# Patient Record
Sex: Female | Born: 1960 | Race: Black or African American | Hispanic: No | Smoking: Current every day smoker
Health system: Southern US, Community
[De-identification: ages and names within clinical notes are randomized; demographics above are authoritative.]

## PROBLEM LIST (undated history)

## (undated) DIAGNOSIS — M549 Dorsalgia, unspecified: Secondary | ICD-10-CM

## (undated) DIAGNOSIS — M1990 Unspecified osteoarthritis, unspecified site: Secondary | ICD-10-CM

## (undated) DIAGNOSIS — E0590 Thyrotoxicosis, unspecified without thyrotoxic crisis or storm: Secondary | ICD-10-CM

## (undated) DIAGNOSIS — G8929 Other chronic pain: Secondary | ICD-10-CM

## (undated) DIAGNOSIS — R7303 Prediabetes: Secondary | ICD-10-CM

## (undated) DIAGNOSIS — I10 Essential (primary) hypertension: Secondary | ICD-10-CM

## (undated) HISTORY — PX: HERNIA REPAIR: SHX51

## (undated) HISTORY — PX: NECK SURGERY: SHX720

## (undated) HISTORY — PX: THYROID SURGERY: SHX805

## (undated) HISTORY — PX: KNEE SURGERY: SHX244

## (undated) HISTORY — PX: ABDOMINAL HYSTERECTOMY: SHX81

---

## 1990-08-28 HISTORY — PX: OVARY SURGERY: SHX727

## 2000-08-28 HISTORY — PX: HAMMER TOE SURGERY: SHX385

## 2004-03-22 ENCOUNTER — Emergency Department (HOSPITAL_COMMUNITY): Admission: EM | Admit: 2004-03-22 | Discharge: 2004-03-22 | Payer: Self-pay | Admitting: Emergency Medicine

## 2005-08-01 ENCOUNTER — Emergency Department (HOSPITAL_COMMUNITY): Admission: EM | Admit: 2005-08-01 | Discharge: 2005-08-01 | Payer: Self-pay | Admitting: Emergency Medicine

## 2007-05-28 ENCOUNTER — Ambulatory Visit (HOSPITAL_COMMUNITY): Admission: RE | Admit: 2007-05-28 | Discharge: 2007-05-28 | Payer: Self-pay | Admitting: Family Medicine

## 2007-07-21 ENCOUNTER — Emergency Department (HOSPITAL_COMMUNITY): Admission: EM | Admit: 2007-07-21 | Discharge: 2007-07-21 | Payer: Self-pay | Admitting: Emergency Medicine

## 2007-07-29 ENCOUNTER — Encounter: Admission: RE | Admit: 2007-07-29 | Discharge: 2007-08-28 | Payer: Self-pay | Admitting: Neurological Surgery

## 2007-08-29 ENCOUNTER — Encounter: Admission: RE | Admit: 2007-08-29 | Discharge: 2007-09-12 | Payer: Self-pay | Admitting: Neurological Surgery

## 2007-08-29 HISTORY — PX: BACK SURGERY: SHX140

## 2008-01-26 ENCOUNTER — Encounter: Admission: RE | Admit: 2008-01-26 | Discharge: 2008-01-26 | Payer: Self-pay | Admitting: Neurological Surgery

## 2008-02-04 ENCOUNTER — Ambulatory Visit (HOSPITAL_COMMUNITY): Admission: RE | Admit: 2008-02-04 | Discharge: 2008-02-05 | Payer: Self-pay | Admitting: Neurological Surgery

## 2008-03-03 ENCOUNTER — Encounter: Admission: RE | Admit: 2008-03-03 | Discharge: 2008-03-03 | Payer: Self-pay | Admitting: Neurological Surgery

## 2008-03-20 ENCOUNTER — Ambulatory Visit: Admission: RE | Admit: 2008-03-20 | Discharge: 2008-03-20 | Payer: Self-pay | Admitting: Neurological Surgery

## 2008-04-02 ENCOUNTER — Inpatient Hospital Stay (HOSPITAL_COMMUNITY): Admission: RE | Admit: 2008-04-02 | Discharge: 2008-04-06 | Payer: Self-pay | Admitting: Neurological Surgery

## 2008-05-05 ENCOUNTER — Encounter: Admission: RE | Admit: 2008-05-05 | Discharge: 2008-05-05 | Payer: Self-pay | Admitting: Neurological Surgery

## 2008-06-02 ENCOUNTER — Encounter: Admission: RE | Admit: 2008-06-02 | Discharge: 2008-06-02 | Payer: Self-pay | Admitting: Neurological Surgery

## 2008-06-25 ENCOUNTER — Ambulatory Visit (HOSPITAL_COMMUNITY): Admission: RE | Admit: 2008-06-25 | Discharge: 2008-06-25 | Payer: Self-pay | Admitting: Neurological Surgery

## 2008-08-18 ENCOUNTER — Encounter: Admission: RE | Admit: 2008-08-18 | Discharge: 2008-08-18 | Payer: Self-pay | Admitting: Neurological Surgery

## 2008-11-16 ENCOUNTER — Encounter: Admission: RE | Admit: 2008-11-16 | Discharge: 2008-11-16 | Payer: Self-pay | Admitting: Neurological Surgery

## 2009-02-15 ENCOUNTER — Encounter: Admission: RE | Admit: 2009-02-15 | Discharge: 2009-02-15 | Payer: Self-pay | Admitting: Neurological Surgery

## 2009-05-17 ENCOUNTER — Encounter: Admission: RE | Admit: 2009-05-17 | Discharge: 2009-05-17 | Payer: Self-pay | Admitting: Neurological Surgery

## 2009-07-06 ENCOUNTER — Encounter: Admission: RE | Admit: 2009-07-06 | Discharge: 2009-07-06 | Payer: Self-pay | Admitting: Neurological Surgery

## 2009-08-18 ENCOUNTER — Ambulatory Visit (HOSPITAL_COMMUNITY): Admission: RE | Admit: 2009-08-18 | Discharge: 2009-08-18 | Payer: Self-pay | Admitting: Anesthesiology

## 2009-08-28 HISTORY — PX: NECK SURGERY: SHX720

## 2009-09-08 ENCOUNTER — Ambulatory Visit (HOSPITAL_COMMUNITY): Admission: RE | Admit: 2009-09-08 | Discharge: 2009-09-08 | Payer: Self-pay | Admitting: Neurological Surgery

## 2009-09-15 ENCOUNTER — Encounter: Admission: RE | Admit: 2009-09-15 | Discharge: 2009-09-15 | Payer: Self-pay | Admitting: Neurosurgery

## 2009-09-23 ENCOUNTER — Emergency Department (HOSPITAL_COMMUNITY): Admission: EM | Admit: 2009-09-23 | Discharge: 2009-09-23 | Payer: Self-pay | Admitting: Emergency Medicine

## 2009-10-03 ENCOUNTER — Emergency Department (HOSPITAL_COMMUNITY): Admission: EM | Admit: 2009-10-03 | Discharge: 2009-10-03 | Payer: Self-pay | Admitting: Emergency Medicine

## 2009-10-19 ENCOUNTER — Encounter: Admission: RE | Admit: 2009-10-19 | Discharge: 2009-10-19 | Payer: Self-pay | Admitting: Neurological Surgery

## 2009-11-18 ENCOUNTER — Inpatient Hospital Stay (HOSPITAL_COMMUNITY): Admission: RE | Admit: 2009-11-18 | Discharge: 2009-11-19 | Payer: Self-pay | Admitting: Neurological Surgery

## 2009-12-01 ENCOUNTER — Ambulatory Visit (HOSPITAL_COMMUNITY): Admission: RE | Admit: 2009-12-01 | Discharge: 2009-12-01 | Payer: Self-pay | Admitting: Obstetrics & Gynecology

## 2009-12-01 ENCOUNTER — Ambulatory Visit: Payer: Self-pay | Admitting: Obstetrics and Gynecology

## 2009-12-09 ENCOUNTER — Encounter: Admission: RE | Admit: 2009-12-09 | Discharge: 2009-12-09 | Payer: Self-pay | Admitting: Obstetrics & Gynecology

## 2009-12-21 ENCOUNTER — Encounter: Admission: RE | Admit: 2009-12-21 | Discharge: 2009-12-21 | Payer: Self-pay | Admitting: Neurological Surgery

## 2010-03-31 IMAGING — CR DG CERVICAL SPINE 1V
1 series · 1 of 1 positions shown · non-contrast
Comparison: Intraoperative C-arm spot films of 02/04/2008

CLINICAL DATA: Anterior cervical spine fusion, having neck pain

CERVICAL SPINE - 1 VIEW

[view not recorded]
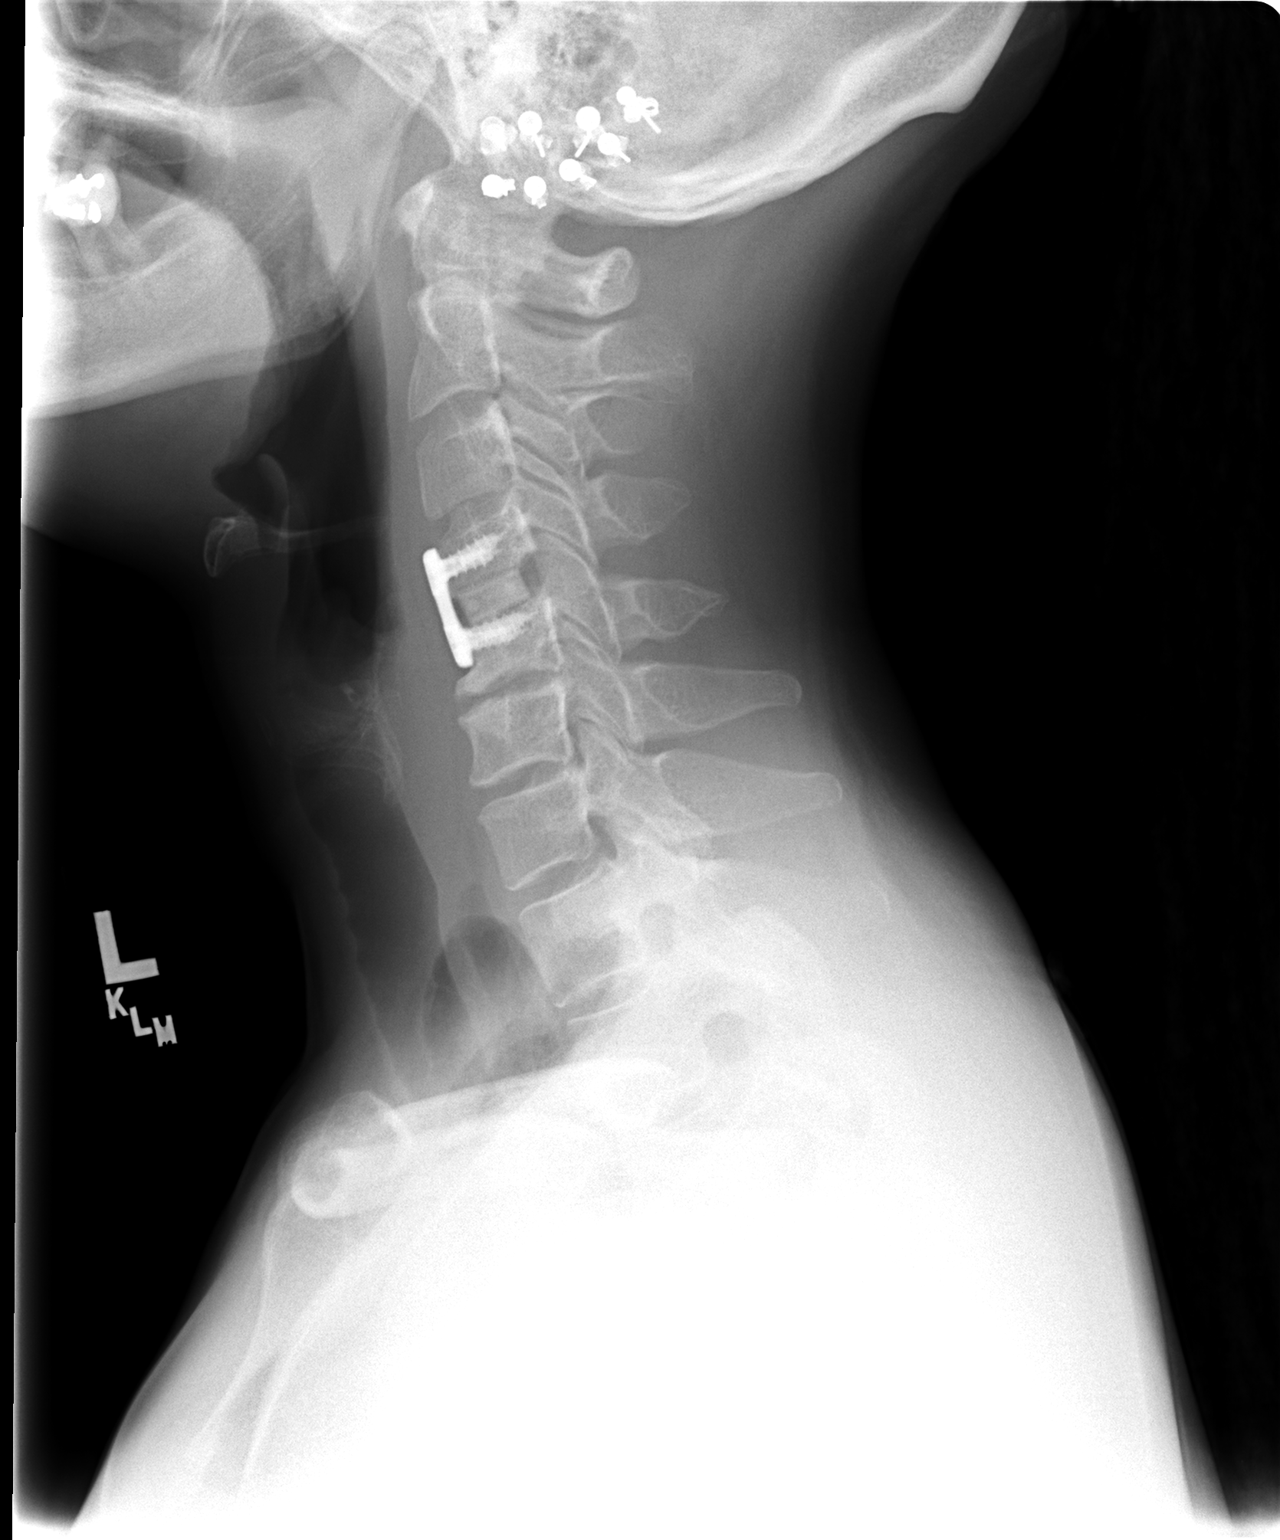

[1 of 1 positions shown; findings below may reference images not displayed]

FINDINGS: The cervical vertebral remain straightened in alignment.
Anterior fusion at C4-5 appears stable.  Interbody fusion plug is
unchanged in position as is the anterior metallic fixation plate.
No prevertebral soft tissue swelling is seen.
IMPRESSION: Stable appearance of anterior fusion at C4-5 with straightened
alignment.

## 2010-04-11 ENCOUNTER — Encounter: Admission: RE | Admit: 2010-04-11 | Discharge: 2010-04-11 | Payer: Self-pay | Admitting: Neurological Surgery

## 2010-09-18 ENCOUNTER — Encounter: Payer: Self-pay | Admitting: *Deleted

## 2010-09-19 ENCOUNTER — Encounter: Payer: Self-pay | Admitting: Family Medicine

## 2010-10-31 ENCOUNTER — Other Ambulatory Visit: Payer: Self-pay | Admitting: Neurological Surgery

## 2010-10-31 DIAGNOSIS — M5412 Radiculopathy, cervical region: Secondary | ICD-10-CM

## 2010-10-31 DIAGNOSIS — M549 Dorsalgia, unspecified: Secondary | ICD-10-CM

## 2010-10-31 DIAGNOSIS — M5020 Other cervical disc displacement, unspecified cervical region: Secondary | ICD-10-CM

## 2010-11-04 ENCOUNTER — Other Ambulatory Visit: Payer: Self-pay | Admitting: Neurological Surgery

## 2010-11-04 ENCOUNTER — Ambulatory Visit
Admission: RE | Admit: 2010-11-04 | Discharge: 2010-11-04 | Disposition: A | Discharge status: Home / Self Care | Payer: Medicaid Other | Source: Ambulatory Visit | Attending: Neurological Surgery | Admitting: Neurological Surgery

## 2010-11-04 DIAGNOSIS — M549 Dorsalgia, unspecified: Secondary | ICD-10-CM

## 2010-11-04 DIAGNOSIS — M5020 Other cervical disc displacement, unspecified cervical region: Secondary | ICD-10-CM

## 2010-11-04 DIAGNOSIS — M5410 Radiculopathy, site unspecified: Secondary | ICD-10-CM

## 2010-11-04 DIAGNOSIS — M5412 Radiculopathy, cervical region: Secondary | ICD-10-CM

## 2010-11-20 LAB — CBC
HCT: 38.7 % (ref 36.0–46.0)
Hemoglobin: 13.7 g/dL (ref 12.0–15.0)
MCV: 101.3 fL — ABNORMAL HIGH (ref 78.0–100.0)
WBC: 6.2 10*3/uL (ref 4.0–10.5)

## 2010-11-20 LAB — SURGICAL PCR SCREEN
MRSA, PCR: NEGATIVE
Staphylococcus aureus: NEGATIVE

## 2010-11-20 LAB — DIFFERENTIAL
Eosinophils Absolute: 0 10*3/uL (ref 0.0–0.7)
Eosinophils Relative: 0 % (ref 0–5)
Lymphocytes Relative: 31 % (ref 12–46)
Lymphs Abs: 1.9 10*3/uL (ref 0.7–4.0)
Monocytes Absolute: 0.2 10*3/uL (ref 0.1–1.0)

## 2010-11-20 LAB — APTT: aPTT: 27 seconds (ref 24–37)

## 2010-11-20 LAB — BASIC METABOLIC PANEL
BUN: 17 mg/dL (ref 6–23)
Chloride: 104 mEq/L (ref 96–112)
GFR calc non Af Amer: >60 mL/min (ref >60)
Glucose, Bld: 92 mg/dL (ref 70–99)
Potassium: 4.3 mEq/L (ref 3.5–5.1)
Sodium: 137 mEq/L (ref 135–145)

## 2011-01-10 NOTE — Discharge Summary (Signed)
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   PROCEDURE:  Posterior lumbar interbody fusion, L4-L5.   HISTORY OF PRESENT ILLNESS:  Ms. Katherine Russell is a 50 year old female who  had a long history of back pain with leg pain, left greater than right.  She had MRIs done about a year apart, which showed significant change at  L4-L5 with a larger disk herniation and significant facet arthrosis and  evidence of some segmental instability.  I recommended a posterior  lumbar interbody fusion at L4-L5.  She understood the risks, benefits,  and expected outcome, and wished to proceed.   HOSPITAL COURSE:  The patient was admitted on April 02, 2008, and taken  to the operating room where she underwent a posterior lumbar interbody  fusion at L4-L5.  The patient tolerated the procedure well, was taken to  the recovery room, and then to the floor in stable condition.  For  details of the operative procedure, please see the dictated operative  note.  The patient's hospital course routine.  There were no  complications.  Her incision remained clean, dry, and intact.  She  remained on a Dilaudid PCA for a couple of days for pain control.  This  was then discontinued.  She was able to walk up and down the halls  without difficulty.  She had a nonfocal neurologic exam.  She was  tolerating regular diet.  She was discharged home in stable condition on  April 06, 2008, with plans to follow up with me in 1 week.   FINAL DIAGNOSIS:  Posterior lumbar interbody fusion,  L4-L5.      Tia Alert, MD  Electronically Signed     DSJ/MEDQ  D:  04/06/2008  T:  04/06/2008  Job:  [id number]

## 2011-01-10 NOTE — Op Note (Signed)
NAMEDAILEY, ALBERSON              ACCOUNT NO.:  [id number]   MEDICAL RECORD NO.:  [id number]          PATIENT TYPE:  INP   LOCATION:  3011                         FACILITY:  [facility]   PHYSICIAN:  Tia Alert, MD     DATE OF BIRTH:  1960-10-09   DATE OF PROCEDURE:  04/02/2008  DATE OF DISCHARGE:                               OPERATIVE REPORT   PREOPERATIVE DIAGNOSES:  Lumbar spondylosis with lumbar disk herniation  L4-5 with degenerative disk disease and diskogenic back pain with leg  pain.   POSTOPERATIVE DIAGNOSES:  Lumbar spondylosis with lumbar disk herniation  L4-5 with degenerative disk disease and diskogenic back pain with leg  pain.   PROCEDURE:  1. Decompressive lumbar laminectomy, hemi facetectomy, and      foraminotomy for decompression at L4 and L5 nerve roots requiring      more work than is typically required a simple posterior lumbar      interbody fusion procedure.  2. Posterior lumbar interbody fusion, L4-5, utilizing a 12 x 22 mm      Tangent interbody bone wedge and 12 x 22 mm PEEK interbody cage      packed with local autograft and Actifuse putty.  3. Intertransverse arthrodesis, L4-5, utilizing locally harvested      morselized autologous bone graft and Actifuse putty.  4. Nonsegmental fixation L4-5 utilizing the Xia pedicle screw system.   SURGEON:  Tia Alert, MD   ASSISTANT:  Donalee Citrin, MD   ANESTHESIA:  General endotracheal.   COMPLICATIONS:  None apparent.   INDICATIONS FOR PROCEDURE:  Ms. Leitha Schuller is a 50 year old female with a  long history of progressively worsening diskogenic back pain.  She had  MRIs which showed worsening disk herniation and spondylosis with facet  arthrosis at L4-5.  I recommended a posterior lumbar interbody fusion at  L4-5 to address what I felt was likely some segmental instability and  facet arthrosis and degenerative disk disease and some canal stenosis  with worsening disk herniation.  She understood the risks,  benefits, and  expected outcome and wished to proceed.   DESCRIPTION OF PROCEDURE:  The patient was taken to the operating room  and after induction of adequate generalized endotracheal anesthesia, she  was rolled into the prone position on chest rolls and all pressure  points were padded.  Her lumbar region was prepped with DuraPrep and  then draped in usual sterile fashion.  Local anesthesia 10 mL was  injected, and a dorsal midline incision was made and carried down to the  lumbosacral fascia.  The fascia was opened and the paraspinous  musculature was taken down in a subperiosteal fashion to expose L4-5.  Intraoperative fluoroscopy confirmed my level and then I took the  dissection out over the facets to expose the transverse processes of L4  and L5.  I used the Leksell rongeur and Kerrison punches to perform a  complete laminectomy and hemi facetectomy at L4-5 to decompress the L4  and L5 nerve roots.  The underlying yellow ligament was removed in a  piecemeal fashion.  The L4 and L5 nerve  roots were followed distally  into their foramina marching along the pedicle wall.  Once the  decompression was complete, we incised the disk space bilaterally and  performed initial diskectomies with pituitary rongeurs and curved  curettes.  There was significant midline disk herniation.  The disk was  very degenerated and disrupted.  We distracted the space to 12 mm and  then used the 12 mm rotating cutter and cutting chisel bilaterally to  prepare the endplates for arthrodesis.  The midline was scraped with an  Epstein curette.  Once a near total diskectomy was performed and the  endplates were prepared, we used a 22-mm PEEK interbody cage packed with  local autograft and Actifuse and tapped this into position on the  patient's left side.  We used a Tangent bone wedge on the patient's  right side and the midline was packed local autograft and Actifuse.  We  then localized the pedicle screw  entry zones utilizing surface landmarks  and lateral fluoroscopy.  We probed each pedicle with a pedicle probe,  tapped each pedicle with a 5.5 tap, and then placed 65 x 45 mm pedicle  screws into the pedicles of L4 and L5 bilaterally.  The pedicles were  probed with a ball probe prior to placement of the screws to assure no  break in the cortex.  We then placed lordotic rods into the multiaxial  screw heads and locked these in position with locking caps and anti-  torque device.  The transverse processes of L4 and L5 were decorticated  and local autograft and Actifuse were placed __________ to  perform  intertransverse arthrodesis L4-5.  We then irrigated with saline  solution containing bacitracin, dried all bleeding points, lined the  dura with Gelfoam, AP and lateral fluoroscopy to check our final  construct, and then closed the muscle and fascia with 0 Vicryl, closed  the subcutaneous and subcuticular tissue with 2-0 and 3-0 Vicryl, and  closed the skin with Benzoin and Steri-Strips.  The drapes were removed.  A sterile dressing was applied.  The patient was awakened from general  anesthesia and transferred to recovery room in stable condition.  At the  end of the procedure, all sponge, needle, and instruments counts were  correct.      Tia Alert, MD  Electronically Signed     DSJ/MEDQ  D:  04/02/2008  T:  04/03/2008  Job:  [id number]

## 2011-01-10 NOTE — Op Note (Signed)
[name], SPAID NO.:  [id number]   MEDICAL RECORD NO.:  [id number]          PATIENT TYPE:  OIB   LOCATION:  3524                         FACILITY:  [facility]   PHYSICIAN:  Tia Alert, MD     DATE OF BIRTH:  31-Mar-1961   DATE OF PROCEDURE:  02/04/2008  DATE OF DISCHARGE:                               OPERATIVE REPORT   PREOPERATIVE DIAGNOSIS:  Cervical disk herniation at C4-5 on the left  with left C5 radiculopathy.   POSTOPERATIVE DIAGNOSIS:  Cervical disk herniation at C4-5 on the left  with left C5 radiculopathy.   PROCEDURES:  1. Decompressive anterior cervical diskectomy at C4-5 for nerve root      decompression  2. Anterior cervical arthrodesis at C4-5 utilizing a 6-mm      corticocancellous allograft.  3. Anterior cervical plating at C4-5 utilizing the Stryker reflex      hybrid plate.   SURGEON:  Tia Alert, MD   ASSISTANT:  Reinaldo Meeker, MD   ANESTHESIA:  General endotracheal.   COMPLICATIONS:  None apparent.   INDICATIONS FOR PROCEDURE:  Ms. Katherine Russell is a 50 year old female who was  referred with severe left arm pain that is seemed to follow on C5  distribution.  She had an MRI, which showed significant spondylosis of  the cervical spine with reversed cervical lordosis with degenerative  disease at C5-6 and C6-7, but a large soft disk herniation at C4-5 on  the left.  She had severe pain.  I recommended a one-level anterior  cervical diskectomy at C4-5.  I did not think C5-6 and C6-7 necessarily  had to be done at this time.  I felt that this would address her pain  syndrome.  She understood the risks, benefits, expected outcome, and  wished to proceed.   DESCRIPTION OF PROCEDURE:  The patient was taken to the operating room.  After induction of adequate generalized endotracheal anesthesia, she was  placed in supine position on the operating room table.  Her right  anterior cervical region was prepped with DuraPrep and draped  in usual  sterile fashion.  A 5 mL of local anesthesia was injected and transverse  incision was made to the right of midline and carried down to the  platysma which was elevated, opened, and undermined with Metzenbaum  scissors.  I then dissected a plane medial to the sternocleidomastoid  muscle and internal carotid artery and lateral to the trachea and  esophagus to expose C4-5.  Intraoperative fluoroscopy confirmed my level  and then I took down the longus colli muscles and placed the Shadow-Line  retractors under this to expose C4-5.  The annulus was incised and  initial diskectomy was done with pituitary rongeurs and curved curettes.  I then used the high-speed drill to drill the endplates.  We drilled to  a height of 6 mm, it was a very collapsed disk space, it was very  spondylotic.  We drilled down to the level of the post longitudinal  ligament.  The operating microscope was brought to the field.  The  posterior longitudinal ligament was  opened with a black nerve hook and  then removed in circumferential fashion.  While undercutting the bodies  of C3 and C4, there was a large disk fragments removed from the foramen  at C4-5 on the left side.  We marched along until the pedicles palpable  and marched along the pedicles to identify the C5 nerve root and  followed it out into the foramen until we were past the pedicle level.  I then palpated with a nerve hook in a circumferential fashion.  Bilateral foraminotomies had been performed.  The undersurfaces of the  C4-C5 vertebral bodies had been undercut.  The dura was full all the way  across and looked well decompressed.  The C5 nerve root was well  decompressed.  The nerve root passed easily without felt no more free  fragments.  I then irrigated with saline solution, dried the surgical  bed with Gelfoam, measured the interspace to be 6 mm, and then used a 6-  mm corticocancellous allograft and tapped this into position at C4-5 and   a lordotic graft was used.  We then used a Stryker reflex hybrid plate  and placed two 12 mm variable angle screws in the bodies of C4-C5 and  checked this under fluoroscopy.  We noticed that we could easily put 14  mm screws and then used two 14 mm screws at C4 and C5 and these locked  in the plate by locking mechanism within the plate.  We then irrigated  with saline solution containing bacitracin, dried all bleeding points  and once meticulous hemostasis was achieved, closed the platysma with 3-  0 Vicryl and closed the subcuticular tissue with 3-0 Vicryl and closed  the skin with benzoin and Steri-strips.  The drapes were removed and the  sterile dressings was applied.  The patient was awakened from the  general anesthesia and transferred to the recovery room in stable  condition.  At the end of the procedure, all sponge,  needle, and  instrument counts were correct.      Tia Alert, MD  Electronically Signed     DSJ/MEDQ  D:  02/04/2008  T:  02/05/2008  Job:  [id number]

## 2011-03-15 IMAGING — CR DG LUMBAR SPINE COMPLETE 4+V
5 series · 5 of 5 positions shown · non-contrast
Comparison: 11/16/2008

CLINICAL DATA: Follow up lumbar fusion.  Low back pain.

LUMBAR SPINE - COMPLETE 4+ VIEW

[view not recorded (1 of 5)]
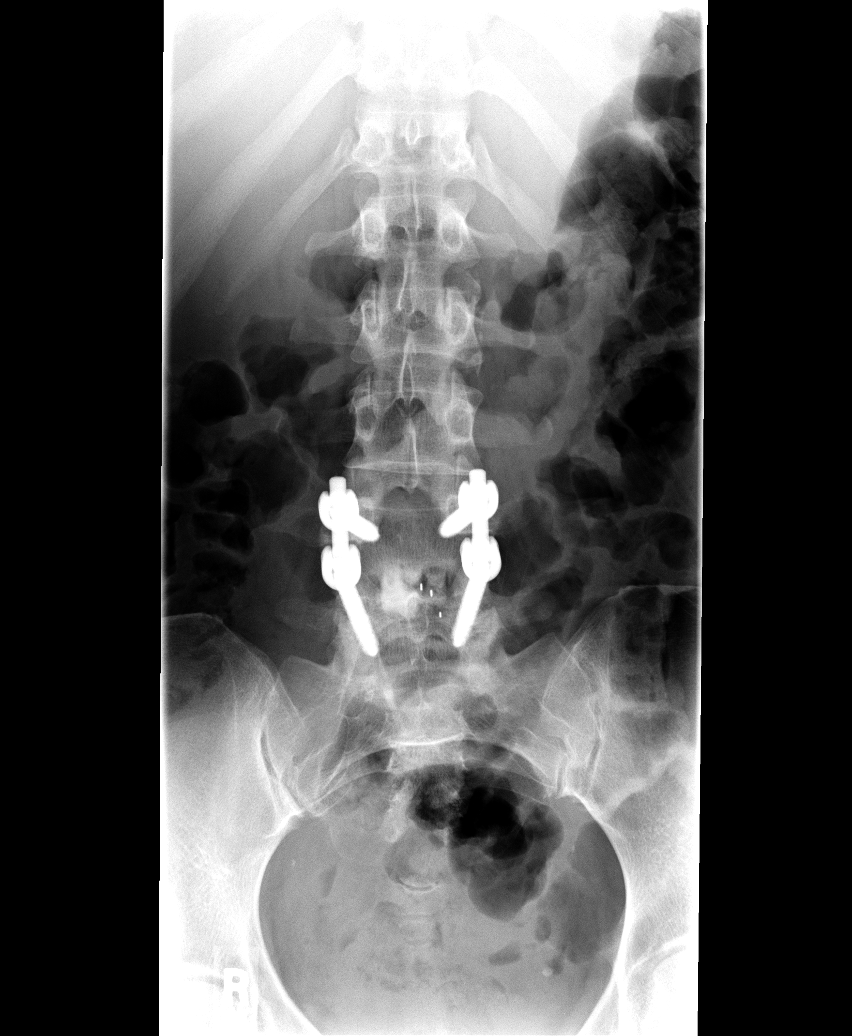

[view not recorded (2 of 5)]
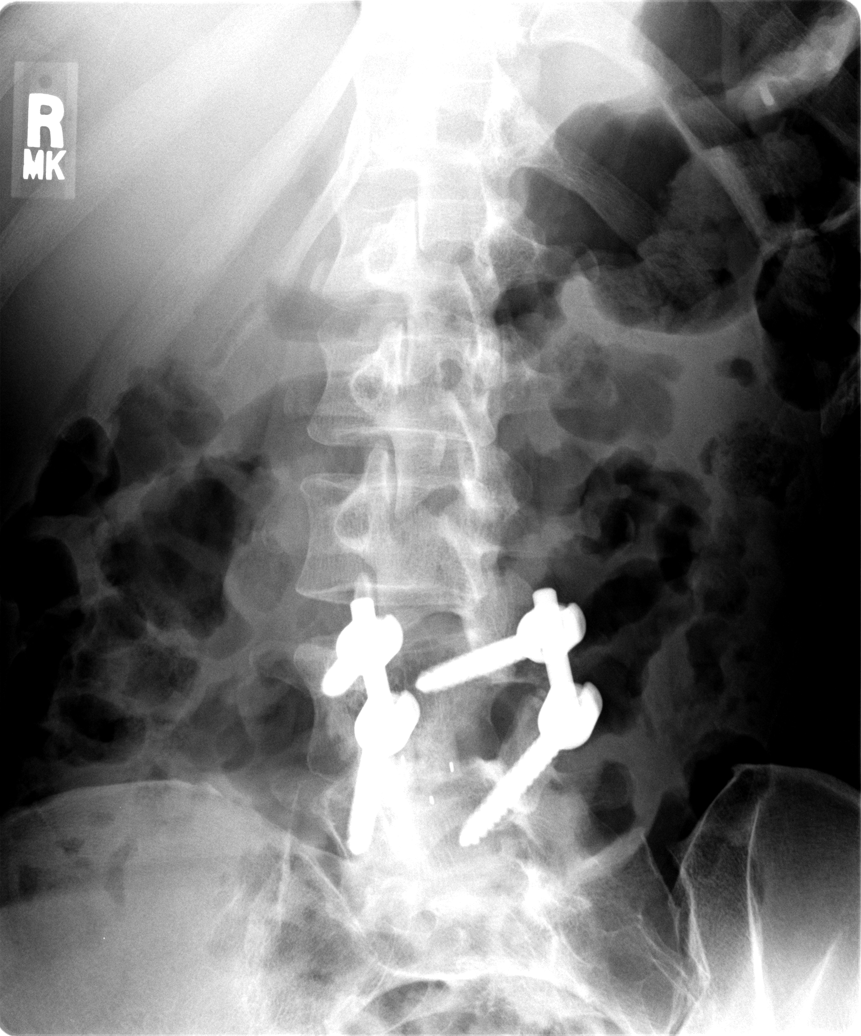

[view not recorded (3 of 5)]
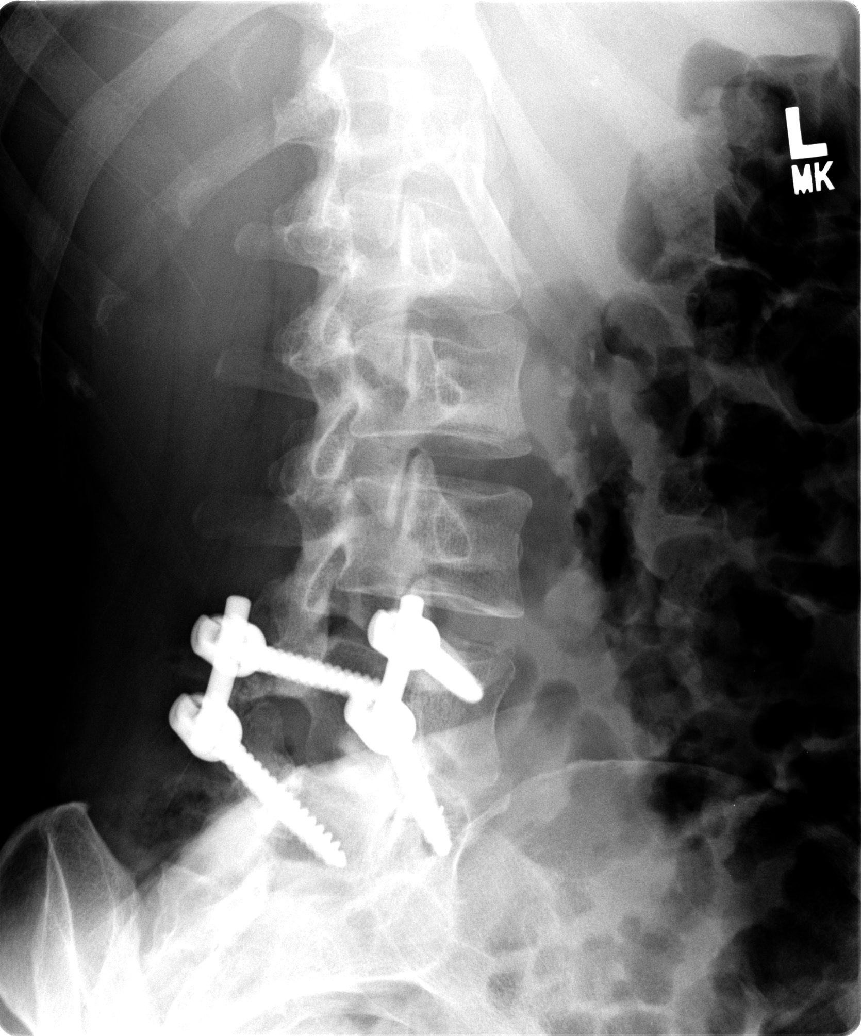

[view not recorded (4 of 5)]
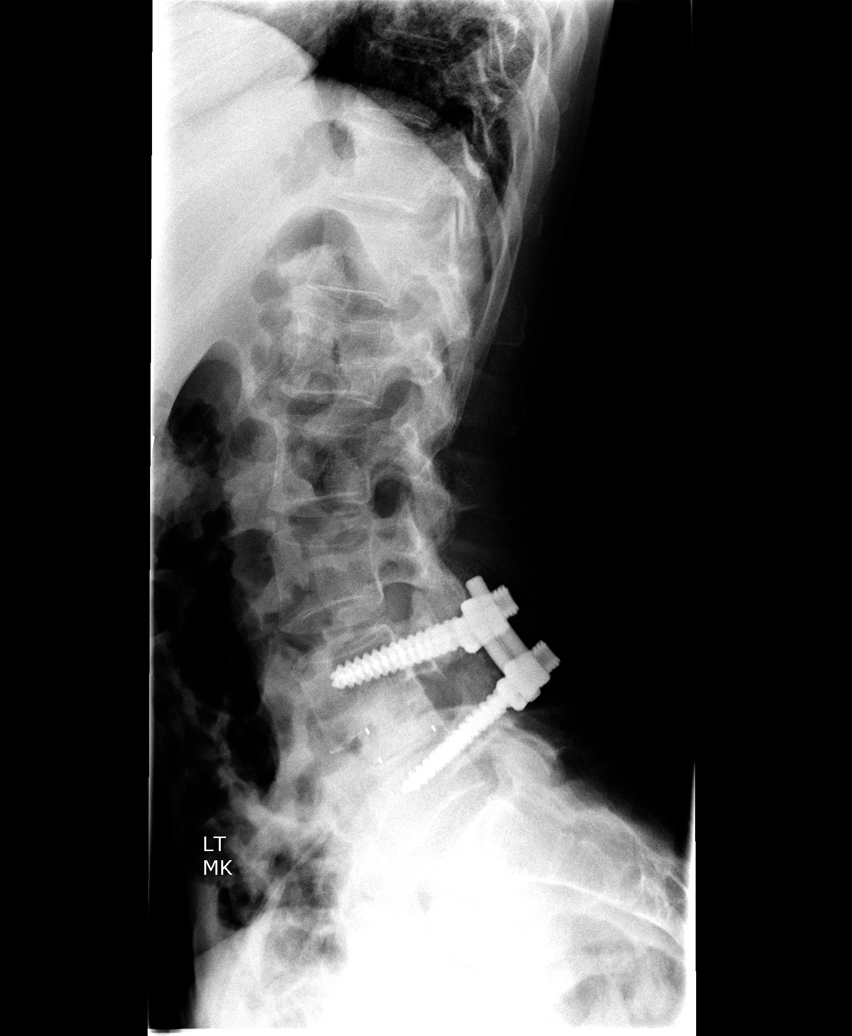

[view not recorded (5 of 5)]
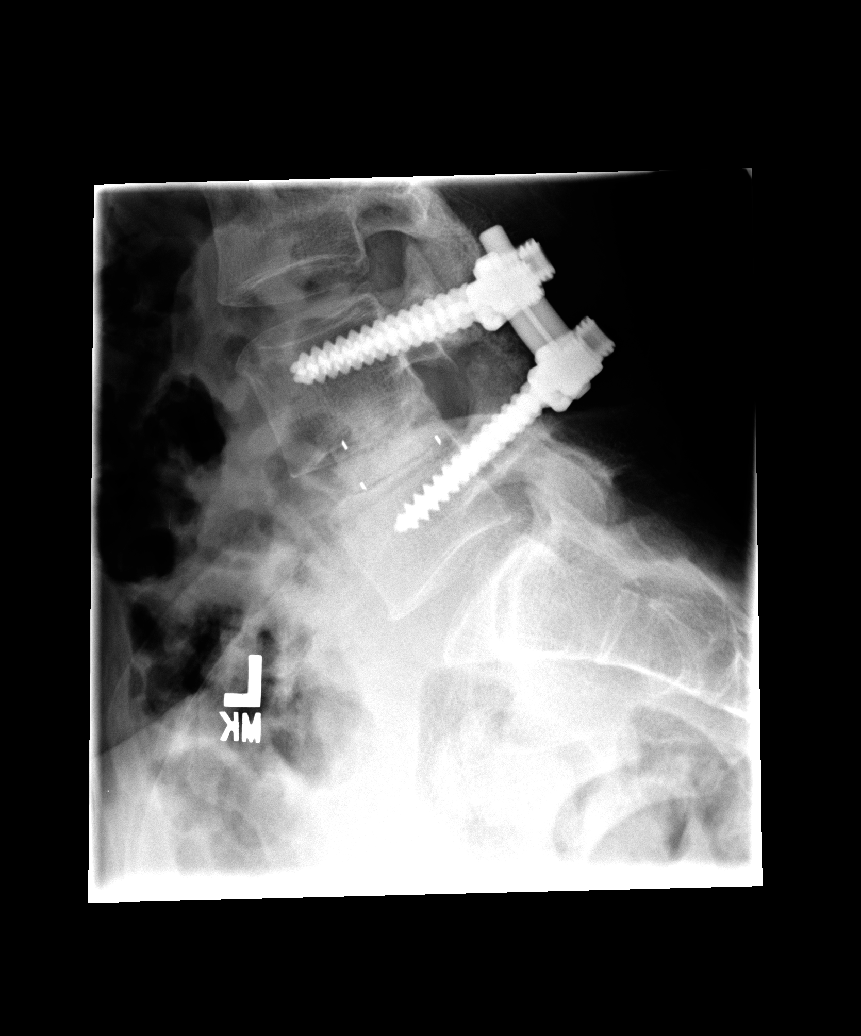

[5 of 5 positions shown; findings below may reference images not displayed]

FINDINGS: PLIF changes stable.  Intact hardware.  Well-positioned
and intact interbody fusion material/spacers.  No
spondylolisthesis.  No disc space narrowing at any level.
IMPRESSION: Stable PLIF changes.

## 2011-05-25 LAB — CBC
Hemoglobin: 13.7
RBC: 3.81 — ABNORMAL LOW
WBC: 6.2

## 2011-05-25 LAB — DIFFERENTIAL
Lymphocytes Relative: 43
Lymphs Abs: 2.7
Monocytes Absolute: 0.3
Monocytes Relative: 4
Neutro Abs: 3.2

## 2011-05-25 LAB — BASIC METABOLIC PANEL
CO2: 25
Calcium: 8.9
GFR calc Af Amer: >60
GFR calc non Af Amer: >60
Sodium: 135

## 2011-05-25 LAB — APTT: aPTT: 28

## 2011-05-26 LAB — BASIC METABOLIC PANEL
BUN: 9
BUN: 10
CO2: 24
Calcium: 9.6
Chloride: 105
Creatinine, Ser: 0.74
Creatinine, Ser: 0.68
GFR calc non Af Amer: >60
GFR calc non Af Amer: >60
Glucose, Bld: 92
Glucose, Bld: 79
Sodium: 135

## 2011-05-26 LAB — DIFFERENTIAL
Basophils Absolute: 0
Basophils Absolute: 0
Basophils Relative: 0
Eosinophils Absolute: 0
Lymphocytes Relative: 38
Lymphs Abs: 1.5
Neutro Abs: 2.9
Neutrophils Relative %: 63
Neutrophils Relative %: 58

## 2011-05-26 LAB — CBC
Hemoglobin: 14
MCHC: 34.3
MCV: 103.6 — ABNORMAL HIGH
Platelets: 301
Platelets: 253
RDW: 12.1
RDW: 12.1
WBC: 4.9

## 2011-05-26 LAB — TYPE AND SCREEN
ABO/RH(D): O POS
ABO/RH(D): O POS

## 2011-05-26 LAB — PROTIME-INR
INR: 0.9
INR: 1
Prothrombin Time: 12.7
Prothrombin Time: 12.8

## 2011-05-26 LAB — ABO/RH: ABO/RH(D): O POS

## 2011-05-26 LAB — APTT: aPTT: 29

## 2011-06-06 LAB — I-STAT 8, (EC8 V) (CONVERTED LAB)
Chloride: 106
Glucose, Bld: 111 — ABNORMAL HIGH
Potassium: 3.6
pCO2, Ven: 42.6 — ABNORMAL LOW
pH, Ven: 7.358 — ABNORMAL HIGH

## 2011-06-06 LAB — CBC
HCT: 42.2
MCHC: 35
MCV: 100.6 — ABNORMAL HIGH
Platelets: 293
WBC: 5.9

## 2011-06-06 LAB — DIFFERENTIAL
Basophils Relative: 1
Eosinophils Absolute: 0.1 — ABNORMAL LOW
Eosinophils Relative: 1
Lymphs Abs: 1.8
Monocytes Relative: 4
Neutrophils Relative %: 62

## 2011-06-06 LAB — POCT I-STAT CREATININE
Creatinine, Ser: 0.8
Operator id: 196461

## 2011-06-20 ENCOUNTER — Other Ambulatory Visit: Payer: Self-pay | Admitting: Neurological Surgery

## 2011-06-20 DIAGNOSIS — M542 Cervicalgia: Secondary | ICD-10-CM

## 2011-06-22 ENCOUNTER — Inpatient Hospital Stay: Admission: RE | Admit: 2011-06-22 | Payer: Medicaid Other | Source: Ambulatory Visit

## 2011-06-24 ENCOUNTER — Ambulatory Visit
Admission: RE | Admit: 2011-06-24 | Discharge: 2011-06-24 | Disposition: A | Discharge status: Home / Self Care | Payer: Medicare Other | Source: Ambulatory Visit | Attending: Neurological Surgery | Admitting: Neurological Surgery

## 2011-06-24 DIAGNOSIS — M542 Cervicalgia: Secondary | ICD-10-CM

## 2011-07-10 ENCOUNTER — Encounter (HOSPITAL_COMMUNITY): Payer: Self-pay | Admitting: Pharmacy Technician

## 2011-07-10 ENCOUNTER — Encounter (HOSPITAL_COMMUNITY)
Admission: RE | Admit: 2011-07-10 | Discharge: 2011-07-10 | Disposition: A | Discharge status: Home / Self Care | Payer: Medicare Other | Source: Ambulatory Visit | Attending: Neurological Surgery | Admitting: Neurological Surgery

## 2011-07-10 ENCOUNTER — Other Ambulatory Visit: Payer: Self-pay

## 2011-07-10 ENCOUNTER — Encounter (HOSPITAL_COMMUNITY): Payer: Self-pay

## 2011-07-10 HISTORY — DX: Thyrotoxicosis, unspecified without thyrotoxic crisis or storm: E05.90

## 2011-07-10 HISTORY — DX: Essential (primary) hypertension: I10

## 2011-07-10 HISTORY — DX: Unspecified osteoarthritis, unspecified site: M19.90

## 2011-07-10 LAB — BASIC METABOLIC PANEL
BUN: 23 mg/dL (ref 6–23)
Creatinine, Ser: 0.85 mg/dL (ref 0.50–1.10)
GFR calc non Af Amer: 77 mL/min — ABNORMAL LOW (ref >90)
Glucose, Bld: 84 mg/dL (ref 70–99)
Potassium: 4.1 mEq/L (ref 3.5–5.1)

## 2011-07-10 LAB — DIFFERENTIAL
Basophils Relative: 0 % (ref 0–1)
Eosinophils Absolute: 0.1 10*3/uL (ref 0.0–0.7)
Eosinophils Relative: 1 % (ref 0–5)
Lymphs Abs: 4.5 10*3/uL — ABNORMAL HIGH (ref 0.7–4.0)

## 2011-07-10 LAB — CBC
MCH: 33.4 pg (ref 26.0–34.0)
MCHC: 34.2 g/dL (ref 30.0–36.0)
MCV: 97.7 fL (ref 78.0–100.0)
Platelets: 203 10*3/uL (ref 150–400)
RBC: 3.86 MIL/uL — ABNORMAL LOW (ref 3.87–5.11)
RDW: 12.4 % (ref 11.5–15.5)

## 2011-07-10 LAB — APTT: aPTT: 26 seconds (ref 24–37)

## 2011-07-10 NOTE — Progress Notes (Signed)
NOTIFIED ALLISON ZELENAK OF PATIENTS ELEVATED BP AT PAT APPT. ALLISON SUGGESTED PATIENT KEEP TRACK OF BP AND CALL MD IF CONT TO BE ELEVATED ABOVE 100 DIASTOLIC.  PATIENT INSTRUCED TO BE SURE AND  TAKE ATENOLOL AM OF SURGERY .

## 2011-07-10 NOTE — Pre-Procedure Instructions (Signed)
20 Katherine Russell  07/10/2011   Your procedure is scheduled on: Thursday 07/12/11   Report to [facility] at 100 PM.  Call this number if you have problems the morning of surgery: [phone]   Remember:   Do not eat food:After Midnight.  Do not drink clear liquids: 4 Hours before arrival.  Take these medicines the morning of surgery with A SIP OF WATER:  GABAPENTIN, ESTRADIOL, SYNTHROID, PAIN PILL , ATENOLOL    Do not wear jewelry, make-up or nail polish.  Do not wear lotions, powders, or perfumes. You may wear deodorant.  Do not shave 48 hours prior to surgery.  Do not bring valuables to the hospital.  Contacts, dentures or bridgework may not be worn into surgery.  Leave suitcase in the car. After surgery it may be brought to your room.  For patients admitted to the hospital, checkout time is 11:00 AM the day of discharge.   Patients discharged the day of surgery will not be allowed to drive home.  Name and phone number of your driver:   Chrissie Noa   Special Instructions: CHG Shower Use Special Wash: 1/2 bottle night before surgery and 1/2 bottle morning of surgery.   Please read over the following fact sheets that you were given: Pain Booklet, MRSA Information and Surgical Site Infection Prevention

## 2011-07-11 MED ORDER — VANCOMYCIN HCL IN DEXTROSE 1-5 GM/200ML-% IV SOLN
1000.0000 mg | INTRAVENOUS | Status: AC
  Administered 2011-07-12: 1000 mg via INTRAVENOUS
  Filled 2011-07-11: qty 200

## 2011-07-11 MED ORDER — DEXAMETHASONE SODIUM PHOSPHATE 10 MG/ML IJ SOLN
10.0000 mg | Freq: Once | INTRAMUSCULAR | Status: AC
  Administered 2011-07-12: 10 mg via INTRAVENOUS
  Filled 2011-07-11: qty 1

## 2011-07-11 NOTE — Consult Note (Signed)
Anesthesia:  50 year old female with hx of HTN, hyperthyroidism, smoker for ACDF on 07/12/11.  BP was 154/102 at PAT on 07/10/11, but she was in pain and was due to take her pain pill.  She reports her BP is not usually this elevated.  She was going to monitor her BP for yesterday and today and was instructed to let her PCP Dr. Maurice Small know if DBP remains > 100, as well as her surgeon.  She is taking Atenolol and has been compliant.  Recheck BP DOS.  EKG noted, stable since last year.  Plan to proceed if BP reasonable and no new CV symptoms.

## 2011-07-12 ENCOUNTER — Encounter (HOSPITAL_COMMUNITY): Payer: Self-pay | Admitting: Vascular Surgery

## 2011-07-12 ENCOUNTER — Inpatient Hospital Stay (HOSPITAL_COMMUNITY): Payer: Medicare Other

## 2011-07-12 ENCOUNTER — Encounter (HOSPITAL_COMMUNITY): Payer: Self-pay | Admitting: Neurological Surgery

## 2011-07-12 ENCOUNTER — Inpatient Hospital Stay (HOSPITAL_COMMUNITY): Payer: Medicare Other | Admitting: Vascular Surgery

## 2011-07-12 ENCOUNTER — Encounter (HOSPITAL_COMMUNITY)
Admission: RE | Disposition: A | Discharge status: Home / Self Care | Payer: Self-pay | Source: Ambulatory Visit | Attending: Neurological Surgery

## 2011-07-12 ENCOUNTER — Observation Stay (HOSPITAL_COMMUNITY)
Admission: RE | Admit: 2011-07-12 | Discharge: 2011-07-13 | Disposition: A | Discharge status: Home / Self Care | Payer: Medicare Other | Source: Ambulatory Visit | Attending: Neurological Surgery | Admitting: Neurological Surgery

## 2011-07-12 DIAGNOSIS — Z981 Arthrodesis status: Secondary | ICD-10-CM | POA: Insufficient documentation

## 2011-07-12 DIAGNOSIS — M47812 Spondylosis without myelopathy or radiculopathy, cervical region: Principal | ICD-10-CM | POA: Insufficient documentation

## 2011-07-12 DIAGNOSIS — Z01810 Encounter for preprocedural cardiovascular examination: Secondary | ICD-10-CM | POA: Insufficient documentation

## 2011-07-12 DIAGNOSIS — I10 Essential (primary) hypertension: Secondary | ICD-10-CM | POA: Insufficient documentation

## 2011-07-12 DIAGNOSIS — F17200 Nicotine dependence, unspecified, uncomplicated: Secondary | ICD-10-CM | POA: Insufficient documentation

## 2011-07-12 DIAGNOSIS — Z01818 Encounter for other preprocedural examination: Secondary | ICD-10-CM | POA: Insufficient documentation

## 2011-07-12 DIAGNOSIS — E039 Hypothyroidism, unspecified: Secondary | ICD-10-CM | POA: Insufficient documentation

## 2011-07-12 DIAGNOSIS — Z01812 Encounter for preprocedural laboratory examination: Secondary | ICD-10-CM | POA: Insufficient documentation

## 2011-07-12 HISTORY — PX: ANTERIOR CERVICAL DECOMP/DISCECTOMY FUSION: SHX1161

## 2011-07-12 SURGERY — ANTERIOR CERVICAL DECOMPRESSION/DISCECTOMY FUSION 2 LEVELS
Anesthesia: General | Wound class: Clean

## 2011-07-12 MED ORDER — HYDROMORPHONE HCL PF 1 MG/ML IJ SOLN
0.2500 mg | INTRAMUSCULAR | Status: DC | PRN
  Administered 2011-07-12 (×6): 0.5 mg via INTRAVENOUS

## 2011-07-12 MED ORDER — LEVOTHYROXINE SODIUM 100 MCG PO TABS
100.0000 ug | ORAL_TABLET | Freq: Every day | ORAL | Status: DC
  Administered 2011-07-13: 100 ug via ORAL
  Filled 2011-07-12 (×2): qty 1

## 2011-07-12 MED ORDER — ACETAMINOPHEN 325 MG PO TABS
ORAL_TABLET | ORAL | Status: AC
  Administered 2011-07-12: 325 mg via ORAL
  Filled 2011-07-12: qty 1

## 2011-07-12 MED ORDER — ONDANSETRON HCL 4 MG/2ML IJ SOLN
4.0000 mg | INTRAMUSCULAR | Status: DC | PRN
  Administered 2011-07-13: 4 mg via INTRAVENOUS
  Filled 2011-07-12: qty 2

## 2011-07-12 MED ORDER — ACETAMINOPHEN 325 MG PO TABS: 650.0000 mg | ORAL_TABLET | ORAL | Status: DC | PRN

## 2011-07-12 MED ORDER — ONDANSETRON HCL 4 MG/2ML IJ SOLN: 4.0000 mg | Freq: Once | INTRAMUSCULAR | Status: DC | PRN

## 2011-07-12 MED ORDER — MORPHINE SULFATE 4 MG/ML IJ SOLN
1.0000 mg | INTRAMUSCULAR | Status: DC | PRN
  Administered 2011-07-12 – 2011-07-13 (×2): 4 mg via INTRAVENOUS
  Administered 2011-07-13: 2 mg via INTRAVENOUS
  Filled 2011-07-12 (×3): qty 1

## 2011-07-12 MED ORDER — DEXAMETHASONE SODIUM PHOSPHATE 4 MG/ML IJ SOLN
4.0000 mg | Freq: Four times a day (QID) | INTRAMUSCULAR | Status: DC
  Filled 2011-07-12 (×4): qty 1

## 2011-07-12 MED ORDER — BUPIVACAINE HCL (PF) 0.25 % IJ SOLN
INTRAMUSCULAR | Status: DC | PRN
  Administered 2011-07-12: 4 mL

## 2011-07-12 MED ORDER — CYCLOBENZAPRINE HCL 10 MG PO TABS
10.0000 mg | ORAL_TABLET | Freq: Three times a day (TID) | ORAL | Status: DC
  Administered 2011-07-13 (×2): 10 mg via ORAL
  Filled 2011-07-12 (×5): qty 1

## 2011-07-12 MED ORDER — DEXAMETHASONE 4 MG PO TABS
4.0000 mg | ORAL_TABLET | Freq: Four times a day (QID) | ORAL | Status: DC
  Administered 2011-07-13 (×2): 4 mg via ORAL
  Filled 2011-07-12 (×6): qty 1

## 2011-07-12 MED ORDER — LACTATED RINGERS IV SOLN
INTRAVENOUS | Status: DC
  Administered 2011-07-12 (×3): via INTRAVENOUS

## 2011-07-12 MED ORDER — OXYCODONE-ACETAMINOPHEN 5-325 MG PO TABS: 1.0000 | ORAL_TABLET | ORAL | Status: AC

## 2011-07-12 MED ORDER — PHENOL 1.4 % MT LIQD: 1.0000 | OROMUCOSAL | Status: DC | PRN

## 2011-07-12 MED ORDER — ESTRADIOL 2 MG PO TABS
2.0000 mg | ORAL_TABLET | Freq: Every day | ORAL | Status: DC
  Administered 2011-07-13: 2 mg via ORAL
  Filled 2011-07-12: qty 1

## 2011-07-12 MED ORDER — SODIUM CHLORIDE 0.9 % IV SOLN: 250.0000 mL | INTRAVENOUS | Status: DC

## 2011-07-12 MED ORDER — OXYCODONE HCL 5 MG PO TABS
ORAL_TABLET | ORAL | Status: AC
  Administered 2011-07-12: 10 mg via ORAL
  Filled 2011-07-12: qty 2

## 2011-07-12 MED ORDER — SODIUM CHLORIDE 0.9 % IR SOLN
Status: DC | PRN
  Administered 2011-07-12: 1000 mL

## 2011-07-12 MED ORDER — THROMBIN 5000 UNITS EX KIT
PACK | CUTANEOUS | Status: DC | PRN
  Administered 2011-07-12 (×3): 5000 [IU] via TOPICAL

## 2011-07-12 MED ORDER — SODIUM CHLORIDE 0.9 % IJ SOLN: 3.0000 mL | Freq: Two times a day (BID) | INTRAMUSCULAR | Status: DC

## 2011-07-12 MED ORDER — ATENOLOL 50 MG PO TABS
50.0000 mg | ORAL_TABLET | Freq: Every day | ORAL | Status: DC
  Administered 2011-07-13: 50 mg via ORAL
  Filled 2011-07-12: qty 1

## 2011-07-12 MED ORDER — GABAPENTIN 600 MG PO TABS
600.0000 mg | ORAL_TABLET | Freq: Three times a day (TID) | ORAL | Status: DC
  Administered 2011-07-13 (×2): 600 mg via ORAL
  Filled 2011-07-12 (×4): qty 1

## 2011-07-12 MED ORDER — HYDRALAZINE HCL 20 MG/ML IJ SOLN
10.0000 mg | Freq: Once | INTRAMUSCULAR | Status: AC
  Administered 2011-07-12: 5 mg via INTRAVENOUS

## 2011-07-12 MED ORDER — OXYCODONE-ACETAMINOPHEN 5-325 MG PO TABS
1.0000 | ORAL_TABLET | ORAL | Status: DC | PRN
  Administered 2011-07-13 (×3): 2 via ORAL
  Filled 2011-07-12 (×3): qty 2

## 2011-07-12 MED ORDER — SODIUM CHLORIDE 0.9 % IJ SOLN: 3.0000 mL | INTRAMUSCULAR | Status: DC | PRN

## 2011-07-12 MED ORDER — HYDRALAZINE HCL 20 MG/ML IJ SOLN
10.0000 mg | Freq: Once | INTRAMUSCULAR | Status: AC
  Administered 2011-07-12 (×2): 5 mg via INTRAVENOUS
  Filled 2011-07-12 (×2): qty 0.5

## 2011-07-12 MED ORDER — DOCUSATE SODIUM 100 MG PO CAPS
100.0000 mg | ORAL_CAPSULE | Freq: Two times a day (BID) | ORAL | Status: DC
  Administered 2011-07-13 (×2): 100 mg via ORAL
  Filled 2011-07-12 (×2): qty 1

## 2011-07-12 MED ORDER — HYDRALAZINE HCL 20 MG/ML IJ SOLN
10.0000 mg | INTRAMUSCULAR | Status: DC | PRN
  Filled 2011-07-12: qty 0.5

## 2011-07-12 MED ORDER — ZOLPIDEM TARTRATE 5 MG PO TABS: 10.0000 mg | ORAL_TABLET | Freq: Every evening | ORAL | Status: DC | PRN

## 2011-07-12 MED ORDER — POTASSIUM CHLORIDE IN NACL 20-0.9 MEQ/L-% IV SOLN
INTRAVENOUS | Status: DC
  Filled 2011-07-12 (×3): qty 1000

## 2011-07-12 MED ORDER — THROMBIN 5000 UNITS EX KIT
PACK | OROMUCOSAL | Status: DC | PRN
  Administered 2011-07-12: 17:00:00 via TOPICAL

## 2011-07-12 MED ORDER — HEMOSTATIC AGENTS (NO CHARGE) OPTIME
TOPICAL | Status: DC | PRN
  Administered 2011-07-12: 1 via TOPICAL

## 2011-07-12 MED ORDER — FENTANYL CITRATE 0.05 MG/ML IJ SOLN
INTRAMUSCULAR | Status: DC | PRN
  Administered 2011-07-12: 100 ug via INTRAVENOUS
  Administered 2011-07-12 (×3): 50 ug via INTRAVENOUS
  Administered 2011-07-12: 150 ug via INTRAVENOUS

## 2011-07-12 MED ORDER — PROPOFOL 10 MG/ML IV EMUL
INTRAVENOUS | Status: DC | PRN
  Administered 2011-07-12: 200 mg via INTRAVENOUS

## 2011-07-12 MED ORDER — MIDAZOLAM HCL 5 MG/5ML IJ SOLN
INTRAMUSCULAR | Status: DC | PRN
  Administered 2011-07-12: 2 mg via INTRAVENOUS

## 2011-07-12 MED ORDER — SODIUM CHLORIDE 0.9 % IR SOLN
Status: DC | PRN
  Administered 2011-07-12: 17:00:00

## 2011-07-12 MED ORDER — ACETAMINOPHEN 650 MG RE SUPP: 650.0000 mg | RECTAL | Status: DC | PRN

## 2011-07-12 MED ORDER — INFLUENZA VIRUS VACC SPLIT PF IM SUSP
0.5000 mL | INTRAMUSCULAR | Status: DC
  Filled 2011-07-12: qty 0.5

## 2011-07-12 MED ORDER — MENTHOL 3 MG MT LOZG: 1.0000 | LOZENGE | OROMUCOSAL | Status: DC | PRN

## 2011-07-12 MED ORDER — ROCURONIUM BROMIDE 100 MG/10ML IV SOLN
INTRAVENOUS | Status: DC | PRN
  Administered 2011-07-12: 50 mg via INTRAVENOUS

## 2011-07-12 SURGICAL SUPPLY — 49 items
BAG DECANTER FOR FLEXI CONT (MISCELLANEOUS) ×2 IMPLANT
BENZOIN TINCTURE PRP APPL 2/3 (GAUZE/BANDAGES/DRESSINGS) ×2 IMPLANT
BUR MATCHSTICK NEURO 3.0 LAGG (BURR) ×2 IMPLANT
CANISTER SUCTION 2500CC (MISCELLANEOUS) ×2 IMPLANT
CLOTH BEACON ORANGE TIMEOUT ST (SAFETY) ×2 IMPLANT
CONT SPEC 4OZ CLIKSEAL STRL BL (MISCELLANEOUS) ×2 IMPLANT
COVER TABLE BACK 60X90 (DRAPES) ×2 IMPLANT
DRAPE C-ARM 42X72 X-RAY (DRAPES) ×4 IMPLANT
DRAPE LAPAROTOMY 100X72 PEDS (DRAPES) ×2 IMPLANT
DRAPE MICROSCOPE ZEISS OPMI (DRAPES) ×2 IMPLANT
DRAPE POUCH INSTRU U-SHP 10X18 (DRAPES) ×2 IMPLANT
DRESSING TELFA 8X3 (GAUZE/BANDAGES/DRESSINGS) ×2 IMPLANT
DRSG OPSITE 4X5.5 SM (GAUZE/BANDAGES/DRESSINGS) ×2 IMPLANT
DURAPREP 6ML APPLICATOR 50/CS (WOUND CARE) ×2 IMPLANT
ELECT COATED BLADE 2.86 ST (ELECTRODE) ×2 IMPLANT
ELECT REM PT RETURN 9FT ADLT (ELECTROSURGICAL) ×2
ELECTRODE REM PT RTRN 9FT ADLT (ELECTROSURGICAL) ×1 IMPLANT
GAUZE SPONGE 4X4 16PLY XRAY LF (GAUZE/BANDAGES/DRESSINGS) IMPLANT
GLOVE BIO SURGEON STRL SZ 6.5 (GLOVE) ×4 IMPLANT
GLOVE BIO SURGEON STRL SZ8 (GLOVE) ×4 IMPLANT
GLOVE BIOGEL PI IND STRL 6.5 (GLOVE) ×1 IMPLANT
GLOVE BIOGEL PI INDICATOR 6.5 (GLOVE) ×1
GLOVE INDICATOR 8.5 STRL (GLOVE) ×2 IMPLANT
GOWN BRE IMP SLV AUR LG STRL (GOWN DISPOSABLE) ×2 IMPLANT
GOWN BRE IMP SLV AUR XL STRL (GOWN DISPOSABLE) ×4 IMPLANT
GOWN STRL REIN 2XL LVL4 (GOWN DISPOSABLE) IMPLANT
HEMOSTAT POWDER KIT SURGIFOAM (HEMOSTASIS) ×2 IMPLANT
IMPLT CONSTRUX LORDO 15X12X7MM (Orthopedic Implant) ×2 IMPLANT
IMPLT CONSTRUX LORDO 15X12X8MM (Orthopedic Implant) ×2 IMPLANT
KIT BASIN OR (CUSTOM PROCEDURE TRAY) ×2 IMPLANT
KIT ROOM TURNOVER OR (KITS) ×2 IMPLANT
NEEDLE HYPO 25X1 1.5 SAFETY (NEEDLE) ×2 IMPLANT
NEEDLE SPNL 20GX3.5 QUINCKE YW (NEEDLE) ×2 IMPLANT
NS IRRIG 1000ML POUR BTL (IV SOLUTION) ×2 IMPLANT
PACK LAMINECTOMY NEURO (CUSTOM PROCEDURE TRAY) ×2 IMPLANT
PAD ARMBOARD 7.5X6 YLW CONV (MISCELLANEOUS) ×6 IMPLANT
PLATE AVIATOR ASSY 2LVL SZ 32 (Plate) ×2 IMPLANT
PUTTY BONE DBX 2.5 MIS (Bone Implant) ×2 IMPLANT
RUBBERBAND STERILE (MISCELLANEOUS) ×4 IMPLANT
SCREW AVIATOR VAR SELFTAP 4X12 (Screw) ×12 IMPLANT
SPONGE INTESTINAL PEANUT (DISPOSABLE) ×2 IMPLANT
SPONGE SURGIFOAM ABS GEL SZ50 (HEMOSTASIS) ×2 IMPLANT
STRIP CLOSURE SKIN 1/2X4 (GAUZE/BANDAGES/DRESSINGS) ×2 IMPLANT
SUT VIC AB 3-0 SH 8-18 (SUTURE) ×4 IMPLANT
SYR 20ML ECCENTRIC (SYRINGE) ×2 IMPLANT
TOWEL OR 17X24 6PK STRL BLUE (TOWEL DISPOSABLE) ×2 IMPLANT
TOWEL OR 17X26 10 PK STRL BLUE (TOWEL DISPOSABLE) ×2 IMPLANT
TRAP SPECIMEN MUCOUS 40CC (MISCELLANEOUS) ×2 IMPLANT
WATER STERILE IRR 1000ML POUR (IV SOLUTION) ×2 IMPLANT

## 2011-07-12 NOTE — Op Note (Signed)
07/12/2011  6:07 PM  PATIENT:  Katherine Russell  50 y.o. female  PRE-OPERATIVE DIAGNOSIS:  Cervical spondylosis with adjacent level stenosis C5-6 C6-7 with neck and left arm pain  POST-OPERATIVE DIAGNOSIS:  Same  PROCEDURE:  1. decompressive anterior cervical discectomy C5-6 C6-7 2. Removal of anterior cervical hardware C4-5 3. Anterior cervical arthrodesis C5-6 C6-7 utilizing peek interbody cages packed with local autograft and morcellized allograft 4. Anterior cervical plating C5-C7 utilizing the Stryker aviator plate  SURGEON:  Marikay Alar, MD  ASSISTANTS: Dr. Wynetta Emery  ANESTHESIA:   General  EBL: 20 ml  Total I/O In: 1900 [I.V.:1900] Out: 50 [Blood:50]  BLOOD ADMINISTERED:none  DRAINS: none   SPECIMEN:  No Specimen  INDICATION FOR PROCEDURE: Patient presented with neck and left arm pain. MRI showed adjacent level spondylosis with neural foraminal stenosis C5-6 and C6-7 on the left. She tried medical management for quite some time without significant relief. Recommended a 2 level anterior cervical discectomy and fusion at C5-6 and C6-7 with removal of hardware C4-5. Patient understood the risks, benefits, and alternatives and potential outcomes and wished to proceed.  PROCEDURE DETAILS: Patient was brought to the operating room placed under general endotracheal anesthesia. Patient was placed in the supine position on the operating room table. The neck was prepped with Duraprep and draped in a sterile fashion.   Three cc of local anesthesia was injected and a transverse incision was made on the right side of the neck.  Dissection was carried down thru the subcutaneous tissue and the platysma was  elevated, opened, and undermined with Metzenbaum scissors.  Dissection was then carried out thru an avascular plane leaving the sternocleidomastoid, carotid artery and jugular vein laterally and the trachea and esophagus medially. The ventral aspect of the vertebral column was identified and a  localizing x-ray was taken. The C5-6 level was identified. The old plate was also identified and blunt dissection was used to expose the plate. Then removed the soft tissues from the surface of the plate , removed the 4 screws from the plate and took plate out . The holes were waxed to stop any bleeding .The longus colli muscles were then elevated and the retractor was placed to expose C5-6 and C6-7. The annulus was incised and the disc space entered. Discectomy was performed with micro-curettes and pituitary rongeurs. I then used the high-speed drill to drill the endplates down to the level of the posterior longitudinal ligament. The drill shavings were saved in a mucous trap for later arthrodesis. The operating microscope was draped and brought into the field provided additional magnification, illumination and visualization. Discectomy was continued posteriorly thru the disc space. Posterior longitudinal ligament was opened with a nerve hook, and then removed along with disc herniation and osteophytes, decompressing the spinal canal and thecal sac. We then continued to remove osteophytic overgrowth and disc material decompressing the neural foramina and exiting nerve roots bilaterally. The scope was angled up and down to help decompress and undercut the vertebral bodies. Once the decompression was completed we could pass a nerve hook circumferentially to assure adequate decompression in the midline and in the neural foramina. So by both visualization and palpation we felt we had an adequate decompression of the neural elements. The left side at C6 and C7 nerve roots were decompressed distally into their foramina and a nerve hook would pass easily along these nerves at the end of the decompression. We then measured the height of the intravertebral disc space and selected a 8  millimeter Peek interbody cage packed with autograft and morcellized allograft. It was then gently positioned in the intravertebral disc space  and countersunk. I then used a Media planner plate and placed 6 variable angle screws into the vertebral bodies and locked them into position. The wound was irrigated with bacitracin solution, checked for hemostasis which was established with bipolar cautery and Surgifoam. Once meticulous hemostasis was achieved, we then proceeded with closure. The platysma was closed with interrupted 3-0 undyed Vicryl suture, the subcuticular layer was closed with interrupted 3-0 undyed Vicryl suture. The skin edges were approximated with steristrips. The drapes were removed. A sterile dressing was applied. The patient was then awakened from general anesthesia and transferred to the recovery room in stable condition. At the end of the procedure all sponge, needle and instrument counts were correct.  PLAN OF CARE: Admit for overnight observation  PATIENT DISPOSITION:  PACU - hemodynamically stable.   Delay start of Pharmacological VTE agent (>24hrs) due to surgical blood loss or risk of bleeding:  yes

## 2011-07-12 NOTE — H&P (Signed)
Subjective:   Patient is a 50 y.o. female admitted for ACDF. The patient first presented to me with complaints of neck pain and arm pain. Onset of symptoms was several months ago. The pain is described as sharp and stabbing and occurs all day. The pain is rated severe, and is located at the with radiation to arm left including extensor surface of forearm.. The symptoms have been progressive. Symptoms are exacerbated by extending head backwards, and are relieved by none. History positive for previous cervical fusion. Previous work up includes cervical spine x-rays, results: mild arthritis changes, MRI of cervical spine, results: disc bulge at C5-C6, C6-C7 and C7-T1 left and CT of cervical spine, results: disc bulge at C5-C6, C6-C7 and C7-T1 left.  Past Medical History  Diagnosis Date  . Hypertension   . Hyperthyroidism   . Arthritis     Past Surgical History  Procedure Date  . Hernia repair AS INFANT   . Thyroid surgery 3/04  AND 3/85   . Abdominal hysterectomy     1983  . Knee surgery     RIGHT   . Hammer toe surgery  2002    RIGHT  SIDE   . Neck surgery 2011  . Back surgery 2009  . Neck surgery   . Ovary surgery 1992    CYST REMOVED     Allergies  Allergen Reactions  . Penicillins Itching  . Penicillins Cross Reactors Itching    History  Substance Use Topics  . Smoking status: Current Everyday Smoker -- 0.5 packs/day  . Smokeless tobacco: Not on file  . Alcohol Use: Yes    No family history on file. Prior to Admission medications   Medication Sig Start Date End Date Taking? Authorizing Provider  atenolol (TENORMIN) 50 MG tablet Take 50 mg by mouth daily.     Yes Historical Provider, MD  calcium carbonate (OS-CAL) 600 MG TABS Take 600 mg by mouth daily.     Yes Historical Provider, MD  cyclobenzaprine (FLEXERIL) 10 MG tablet Take 10 mg by mouth 3 (three) times daily.     Yes Historical Provider, MD  estradiol (ESTRACE) 2 MG tablet Take 2 mg by mouth daily.     Yes  Historical Provider, MD  gabapentin (NEURONTIN) 600 MG tablet Take 600 mg by mouth 3 (three) times daily.     Yes Historical Provider, MD  levothyroxine (SYNTHROID, LEVOTHROID) 100 MCG tablet Take 100 mcg by mouth daily.     Yes Historical Provider, MD  methylPREDNISolone (MEDROL, PAK,) 4 MG tablet Take 4 mg by mouth. follow package directions Nurse called back after mr had been reviewed and patient had forgot she was on the dose pak.    Yes Historical Provider, MD  Multiple Vitamin (MULTIVITAMIN) capsule Take 1 capsule by mouth daily.     Yes Historical Provider, MD  nabumetone (RELAFEN) 750 MG tablet Take 750 mg by mouth 2 (two) times daily.     Yes Historical Provider, MD     Review of Systems  Positive ROS: neg  All other systems have been reviewed and were otherwise negative with the exception of those mentioned in the HPI and as above.  Objective: Vital signs in last 24 hours: Temp:  [97.9 F (36.6 C)] 97.9 F (36.6 C) (11/14 1308) Pulse Rate:  [66] 66  (11/14 1308) Resp:  [18] 18  (11/14 1308) BP: (130)/(63) 130/63 mmHg (11/14 1308) SpO2:  [98 %] 98 % (11/14 1308)  General Appearance: Alert, cooperative, no distress,  appears stated age Head: Normocephalic, without obvious abnormality, atraumatic Eyes: PERRL, conjunctiva/corneas clear, EOM's intact, fundi benign, both eyes      Ears: Normal TM's and external ear canals, both ears Throat: Lips, mucosa, and tongue normal; teeth and gums normal Neck: Supple, symmetrical, trachea midline, no adenopathy; thyroid: No enlargement/tenderness/nodules; no carotid bruit or JVD Back: Symmetric, no curvature, ROM normal, no CVA tenderness Lungs: Clear to auscultation bilaterally, respirations unlabored Heart: Regular rate and rhythm, S1 and S2 normal, no murmur, rub or gallop Abdomen: Soft, non-tender, bowel sounds active all four quadrants, no masses, no organomegaly Extremities: Extremities normal, atraumatic, no cyanosis or  edema Pulses: 2+ and symmetric all extremities Skin: Skin color, texture, turgor normal, no rashes or lesions  NEUROLOGIC:  Mental status: Alert and oriented x4, no aphasia, good attention span, fund of knowledge and memory  Motor Exam - grossly normal Sensory Exam - grossly normal Reflexes: normal Coordination - grossly normal Gait - grossly normal Balance - grossly normal Cranial Nerves: I: smell Not tested  II: visual acuity  OS: nl    OD: nl  II: visual fields Full to confrontation  II: pupils Equal, round, reactive to light  III,VII: ptosis None  III,IV,VI: extraocular muscles  Full ROM  V: mastication Normal  V: facial light touch sensation  Normal  V,VII: corneal reflex  Present  VII: facial muscle function - upper  Normal  VII: facial muscle function - lower Normal  VIII: hearing Not tested  IX: soft palate elevation  Normal  IX,X: gag reflex Present  XI: trapezius strength  5/5  XI: sternocleidomastoid strength 5/5  XI: neck flexion strength  5/5  XII: tongue strength  Normal    Data Review Lab Results  Component Value Date   WBC 7.4 07/10/2011   HGB 12.9 07/10/2011   HCT 37.7 07/10/2011   MCV 97.7 07/10/2011   PLT 203 07/10/2011   Lab Results  Component Value Date   NA 135 07/10/2011   K 4.1 07/10/2011   CL 99 07/10/2011   CO2 25 07/10/2011   BUN 23 07/10/2011   CREATININE 0.85 07/10/2011   GLUCOSE 84 07/10/2011   Lab Results  Component Value Date   INR 0.88 07/10/2011    Assessment:   Cervical neck pain with herniated nucleus pulposus/ spondylosis/ 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 Patient has failed conservative therapy. Plan is to move forward with ACDF and plating at C5-6 and C6-7 with removal of C4-5 plate.  Plan:   I explained the condition and procedure to the patient and answered any questions.  Patient wishes to proceed with procedure as planned. Understands risks/ benefits/ and expected or typical outcomes.  JONES,DAVID S 07/12/2011 3:43  PM

## 2011-07-12 NOTE — Anesthesia Preprocedure Evaluation (Addendum)
Anesthesia Evaluation  Patient identified by MRN, date of birth, ID band Patient awake    Reviewed: Allergy & Precautions, H&P , NPO status , Patient's Chart, lab work & pertinent test results  Airway Mallampati: I TM Distance: >3 FB Neck ROM: full    Dental  (+) Edentulous Upper, Edentulous Lower and Dental Advisory Given   Pulmonary Current Smoker,    Pulmonary exam normal       Cardiovascular hypertension, On Medications regular Normal    Neuro/Psych Negative Neurological ROS     GI/Hepatic negative GI ROS, Neg liver ROS,   Endo/Other  Negative Endocrine ROSHyperthyroidism   Renal/GU negative Renal ROS  Genitourinary negative   Musculoskeletal   Abdominal   Peds  Hematology negative hematology ROS (+)   Anesthesia Other Findings S/p thyroidectomy  Reproductive/Obstetrics                         Anesthesia Physical Anesthesia Plan  ASA: II  Anesthesia Plan: General   Post-op Pain Management:    Induction: Intravenous  Airway Management Planned: Oral ETT  Additional Equipment:   Intra-op Plan:   Post-operative Plan: Extubation in OR  Informed Consent: I have reviewed the patients History and Physical, chart, labs and discussed the procedure including the risks, benefits and alternatives for the proposed anesthesia with the patient or authorized representative who has indicated his/her understanding and acceptance.     Plan Discussed with: Anesthesiologist, CRNA and Surgeon  Anesthesia Plan Comments:         Anesthesia Quick Evaluation

## 2011-07-12 NOTE — Preoperative (Signed)
Beta Blockers   Reason not to administer Beta Blockers:Not Applicable, took atenolol 11/14 AM

## 2011-07-12 NOTE — Anesthesia Postprocedure Evaluation (Signed)
  Anesthesia Post-op Note  Patient: Katherine Russell  Procedure(s) Performed:  ANTERIOR CERVICAL DECOMPRESSION/DISCECTOMY FUSION 2 LEVELS - Cervical five-six, six-seven anterior cervical decompression/discectomy fusion with removal of hardware from Cervical four-five  Patient Location: PACU  Anesthesia Type: General  Level of Consciousness: awake  Airway and Oxygen Therapy: Patient Spontanous Breathing  Post-op Pain: mild  Post-op Assessment: Post-op Vital signs reviewed  Post-op Vital Signs: stable  Complications: No apparent anesthesia complications

## 2011-07-12 NOTE — Transfer of Care (Signed)
Immediate Anesthesia Transfer of Care Note  Patient: Katherine Russell  Procedure(s) Performed:  ANTERIOR CERVICAL DECOMPRESSION/DISCECTOMY FUSION 2 LEVELS - Cervical five-six, six-seven anterior cervical decompression/discectomy fusion with removal of hardware from Cervical four-five  Patient Location: PACU  Anesthesia Type: General  Level of Consciousness: sedated  Airway & Oxygen Therapy: Patient Spontanous Breathing and Patient connected to nasal cannula oxygen  Post-op Assessment: Report given to PACU RN and Post -op Vital signs reviewed and stable  Post vital signs: stable  Complications: No apparent anesthesia complications

## 2011-07-12 NOTE — Anesthesia Procedure Notes (Signed)
Procedure Name: Intubation Date/Time: 07/12/2011 4:15 PM Performed by: Alanda Amass Pre-anesthesia Checklist: Patient identified, Emergency Drugs available, Timeout performed, Suction available and Patient being monitored Patient Re-evaluated:Patient Re-evaluated prior to inductionOxygen Delivery Method: Circle System Utilized Preoxygenation: Pre-oxygenation with 100% oxygen Intubation Type: IV induction Ventilation: Mask ventilation without difficulty Laryngoscope Size: Mac and 3 Grade View: Grade II Tube type: Oral Tube size: 7.5 mm Placement Confirmation: ETT inserted through vocal cords under direct vision,  breath sounds checked- equal and bilateral and positive ETCO2 Secured at: 21 cm Tube secured with: Tape Dental Injury: Teeth and Oropharynx as per pre-operative assessment

## 2011-07-13 ENCOUNTER — Encounter (HOSPITAL_COMMUNITY): Payer: Self-pay | Admitting: Neurological Surgery

## 2011-07-13 LAB — GLUCOSE, CAPILLARY

## 2011-07-13 LAB — RAPID URINE DRUG SCREEN, HOSP PERFORMED
Barbiturates: NOT DETECTED
Benzodiazepines: NOT DETECTED
Cocaine: POSITIVE — AB
Opiates: POSITIVE — AB

## 2011-07-13 MED ORDER — ASPIRIN 325 MG PO TABS
325.0000 mg | ORAL_TABLET | Freq: Once | ORAL | Status: AC
  Administered 2011-07-13: 325 mg via ORAL
  Filled 2011-07-13: qty 1

## 2011-07-13 NOTE — Progress Notes (Signed)
  Subjective: Patient reports facial numbness around mouth Bilaterally. Reports some visual "halo" and "shine" but no scotoma. Chronic headache unchanged. L arm pain improved. Looks much more comfortable than pre-op. Good strength, no drift, awake/ alert, no aphasia. Discussed with dr. Pearlean Brownie and he has no real concern for stroke or TIA. He suggests simple observation for now. Pt admits to cocaine use last week, as well as marijuana use.  Objective: Vital signs in last 24 hours: Temp:  [96.8 F (36 C)-98.3 F (36.8 C)] 98.3 F (36.8 C) (11/15 0800) Pulse Rate:  [56-81] 64  (11/15 0800) Resp:  [18-55] 20  (11/15 0800) BP: (119-218)/(63-109) 163/83 mmHg (11/15 0800) SpO2:  [94 %-100 %] 100 % (11/15 0800)  Intake/Output from previous day: 11/14 0701 - 11/15 0700 In: 1900 [I.V.:1900] Out: 650 [Urine:600; Blood:50] Intake/Output this shift:    Neurologic: Grossly normal, no visual deficit, no drift, a&o, no aphasia  Lab Results: Lab Results  Component Value Date   WBC 7.4 07/10/2011   HGB 12.9 07/10/2011   HCT 37.7 07/10/2011   MCV 97.7 07/10/2011   PLT 203 07/10/2011   Lab Results  Component Value Date   INR 0.88 07/10/2011   BMET Lab Results  Component Value Date   NA 135 07/10/2011   K 4.1 07/10/2011   CL 99 07/10/2011   CO2 25 07/10/2011   GLUCOSE 84 07/10/2011   BUN 23 07/10/2011   CREATININE 0.85 07/10/2011   CALCIUM 9.6 07/10/2011    Studies/Results: Dg Cervical Spine 1 View  07/12/2011  *RADIOLOGY REPORT*  Clinical Data: C5-C7 ACDF with hardware removal.  DG SPINE PORTABLE - 1 VIEW  Comparison: 06/24/2011  Findings: Previous anterior hardware at C4-5 has been removed. Posterior fusion hardware at C4-5 remains in place.  There is new anterior cervical discectomy and fusion from C5-C7 with interbody fusion material, anterior plate and screw fixation.  Components appear grossly well positioned.  IMPRESSION: Removal of C4-5 hardware.  New ACDF C5-C7.  Original Report  Authenticated By: Thomasenia Sales, M.D.   Dg C-arm 61-120 Min  07/12/2011  CLINICAL DATA: C5-6, 6-7 ACDF   C-ARM 61-120 MINUTES  Fluoroscopy was utilized by the requesting physician.  No radiographic  interpretation.      Assessment/Plan: Suspect migraine...gave ASA, observe for now. Neuro exam normal. Likely home later today. Will check UDS.   LOS: 1 day    JONES,DAVID S 07/13/2011, 9:47 AM

## 2011-07-13 NOTE — Discharge Summary (Signed)
Physician Discharge Summary  Patient ID: Katherine Russell MRN: [id number] DOB/AGE: 52-May-1962 52 y.o.  Admit date: 07/12/2011 Discharge date: 07/13/2011  Admission Diagnoses: adjacent level stenosis C5-6, C6-7 with L arm pain     Discharge Diagnoses: same   Discharged Condition: good  Hospital Course: The patient was admitted on 07/12/2011 and taken to the operating room where the patient underwent ACDF C5-6, C6-7. The patient tolerated the procedure well and was taken to the recovery room and then to the floor in stable condition. The hospital course was routine. There were no complications. The wound remained clean dry and intact. The patient remained afebrile with stable vital signs, and tolerated a regular diet. The patient continued to increase activities, and pain was well controlled with oral pain medications.   Consults: none  Significant Diagnostic Studies:  Results for orders placed during the hospital encounter of 07/12/11  GLUCOSE, CAPILLARY      Component Value Range   Glucose-Capillary 123 (*) 70 - 99 (mg/dL)   Comment 1 Notify RN     Comment 2 Documented in Chart    URINE RAPID DRUG SCREEN (HOSP PERFORMED)      Component Value Range   Opiates POSITIVE (*) NONE DETECTED    Cocaine POSITIVE (*) NONE DETECTED    Benzodiazepines NONE DETECTED  NONE DETECTED    Amphetamines NONE DETECTED  NONE DETECTED    Tetrahydrocannabinol POSITIVE (*) NONE DETECTED    Barbiturates NONE DETECTED  NONE DETECTED     Dg Chest 2 View  07/10/2011  *RADIOLOGY REPORT*  Clinical Data: Preoperative respiratory films.  CHEST - 2 VIEW  Comparison: PA and lateral chest 11/12/2009.  Findings: The lungs are clear.  Heart size is normal.  No pneumothorax or pleural effusion.  IMPRESSION: No acute disease.  Original Report Authenticated By: Bernadene Bell. Maricela Curet, M.D.   Dg Cervical Spine 1 View  07/12/2011  *RADIOLOGY REPORT*  Clinical Data: C5-C7 ACDF with hardware removal.  DG SPINE PORTABLE  - 1 VIEW  Comparison: 06/24/2011  Findings: Previous anterior hardware at C4-5 has been removed. Posterior fusion hardware at C4-5 remains in place.  There is new anterior cervical discectomy and fusion from C5-C7 with interbody fusion material, anterior plate and screw fixation.  Components appear grossly well positioned.  IMPRESSION: Removal of C4-5 hardware.  New ACDF C5-C7.  Original Report Authenticated By: Thomasenia Sales, M.D.   Mr Cervical Spine Wo Contrast  06/24/2011  *RADIOLOGY REPORT*  Clinical Data: Chronic neck pain radiating into both arms.  New left-sided neck pain and arm pain with numbness.  Prior ACDF.  MRI CERVICAL SPINE WITHOUT CONTRAST  Technique:  Multiplanar and multiecho pulse sequences of the cervical spine, to include the craniocervical junction and cervicothoracic junction, were obtained according to standard protocol without intravenous contrast.  Comparison: CT myelogram 10/19/2009.  Cervical spine MRI 09/08/2009.  Findings: Reversal of the normal cervical lordosis.  C4-C5 ACDF is present.  Cervical cord demonstrates normal caliber and intramedullary signal.  Flow voids are present in both vertebral arteries.  There is posterior rod and screw fixation at C4-C5. Paraspinal soft tissues appear within normal limits.  C2-C3:  Negative.  C3-C4:  2 mm retrolisthesis of C3 ounce C4.  Shallow disc bulge with mild central stenosis.  Disc bulge may just contacts the ventral aspect of the cervical cord.  Foramina appear patent.  C4-C5:  ACDF and posterior rod and screw fixation.  Central canal and foramina appear patent.  C5-C6:  Broad-based disc osteophyte  complex is present with mild central stenosis.  No cord deformity.  Left greater than right foraminal stenosis associated uncovertebral spurring.  C6-C7:  Shallow broad-based disc osteophyte complex is present which is left eccentric.  There is left foraminal stenosis which appears due to a left foraminal disc protrusion.  Probable  uncovertebral spurring contributes.  Mild left facet arthrosis is also present.  Right foramen appears patent.  Central stenosis is mild.  No cord deformity.  C7-T1:  Shallow broad-based disc bulge.  There is a left foraminal disc extrusion producing left foraminal stenosis potentially affects the left C8 nerve.  Central canal appears adequately patent.  Right foramen is patent.  IMPRESSION: 1.  Severe left C6-C7 foraminal stenosis potentially affecting the left C7 nerve.  This appears due to a left foraminal disc protrusion with probable small contribution from uncovertebral spurring.  Shallow disc osteophyte complex at C6-C7 with mild central stenosis.  No cord deformity. 2.  Left C7-T1 foraminal disc extrusion potentially associated with left C8 radiculopathy. 3.  Uncomplicated anterior and posterior fusion at C4-C5.  No recurrent stenosis. 4.  More mild degenerative disease at C2-C3 and C3-C4.  2 mm retrolisthesis of C3 on C4 with disc osteophyte complex consistent with adjacent segment disease.  Original Report Authenticated By: Andreas Newport, M.D.   Dg C-arm 61-120 Min  07/12/2011  CLINICAL DATA: C5-6, 6-7 ACDF   C-ARM 61-120 MINUTES  Fluoroscopy was utilized by the requesting physician.  No radiographic  interpretation.      Antibiotics:  Anti-infectives     Start     Dose/Rate Route Frequency Ordered Stop   07/12/11 1656   bacitracin 50,000 Units in sodium chloride irrigation 0.9 % 500 mL irrigation  Status:  Discontinued          As needed 07/12/11 1657 07/12/11 1811   07/11/11 1515   vancomycin (VANCOCIN) IVPB 1000 mg/200 mL premix        1,000 mg 200 mL/hr over 60 Minutes Intravenous 120 min pre-op 07/11/11 1510 07/12/11 1605          Discharge Exam: Blood pressure 154/86, pulse 66, temperature 98.5 F (36.9 C), temperature source Oral, resp. rate 16, weight 63.1 kg (139 lb 1.8 oz), SpO2 98.00%. Neurologic: Grossly normal  Discharge Medications:   Current Discharge Medication  List    CONTINUE these medications which have NOT CHANGED   Details  atenolol (TENORMIN) 50 MG tablet Take 50 mg by mouth daily.      calcium carbonate (OS-CAL) 600 MG TABS Take 600 mg by mouth daily.      cyclobenzaprine (FLEXERIL) 10 MG tablet Take 10 mg by mouth 3 (three) times daily.      estradiol (ESTRACE) 2 MG tablet Take 2 mg by mouth daily.      gabapentin (NEURONTIN) 600 MG tablet Take 600 mg by mouth 3 (three) times daily.      levothyroxine (SYNTHROID, LEVOTHROID) 100 MCG tablet Take 100 mcg by mouth daily.      Multiple Vitamin (MULTIVITAMIN) capsule Take 1 capsule by mouth daily.        STOP taking these medications     methylPREDNISolone (MEDROL, PAK,) 4 MG tablet      nabumetone (RELAFEN) 750 MG tablet         Disposition: home   Final Dx: acdf c5-6, c6-7  Discharge Orders    Future Orders Please Complete By Expires   Diet - low sodium heart healthy      Increase activity  slowly      Driving Restrictions      Comments:   1 week   Lifting restrictions      Comments:   Less than 8 lbs   Remove dressing in 24 hours      Call MD for:  temperature >100.4      Call MD for:  persistant nausea and vomiting      Call MD for:  severe uncontrolled pain      Call MD for:  redness, tenderness, or signs of infection (pain, swelling, redness, odor or green/yellow discharge around incision site)         Follow-up Information    Follow up with Brailon Don S in 2 weeks.   Contact information:   [address] [place] [address] [phone]           Signed: Tia Alert 07/13/2011, 4:53 PM

## 2011-07-25 MED FILL — Sodium Chloride IV Soln 0.9%: INTRAVENOUS | Qty: 500 | Status: AC

## 2011-08-15 ENCOUNTER — Other Ambulatory Visit: Payer: Self-pay | Admitting: Neurological Surgery

## 2011-08-15 ENCOUNTER — Ambulatory Visit
Admission: RE | Admit: 2011-08-15 | Discharge: 2011-08-15 | Disposition: A | Discharge status: Home / Self Care | Payer: Medicare Other | Source: Ambulatory Visit | Attending: Neurological Surgery | Admitting: Neurological Surgery

## 2011-08-15 DIAGNOSIS — M542 Cervicalgia: Secondary | ICD-10-CM

## 2011-11-05 ENCOUNTER — Encounter (HOSPITAL_COMMUNITY): Payer: Self-pay

## 2011-11-05 ENCOUNTER — Emergency Department (HOSPITAL_COMMUNITY)
Admission: EM | Admit: 2011-11-05 | Discharge: 2011-11-05 | Disposition: A | Discharge status: Home / Self Care | Payer: Medicare Other | Attending: Emergency Medicine | Admitting: Emergency Medicine

## 2011-11-05 DIAGNOSIS — Y838 Other surgical procedures as the cause of abnormal reaction of the patient, or of later complication, without mention of misadventure at the time of the procedure: Secondary | ICD-10-CM | POA: Insufficient documentation

## 2011-11-05 DIAGNOSIS — I10 Essential (primary) hypertension: Secondary | ICD-10-CM | POA: Insufficient documentation

## 2011-11-05 DIAGNOSIS — E0590 Thyrotoxicosis, unspecified without thyrotoxic crisis or storm: Secondary | ICD-10-CM | POA: Insufficient documentation

## 2011-11-05 DIAGNOSIS — M7989 Other specified soft tissue disorders: Secondary | ICD-10-CM | POA: Insufficient documentation

## 2011-11-05 DIAGNOSIS — M25439 Effusion, unspecified wrist: Secondary | ICD-10-CM | POA: Insufficient documentation

## 2011-11-05 DIAGNOSIS — T8130XA Disruption of wound, unspecified, initial encounter: Secondary | ICD-10-CM

## 2011-11-05 DIAGNOSIS — M129 Arthropathy, unspecified: Secondary | ICD-10-CM | POA: Insufficient documentation

## 2011-11-05 DIAGNOSIS — M79609 Pain in unspecified limb: Secondary | ICD-10-CM | POA: Insufficient documentation

## 2011-11-05 DIAGNOSIS — Z79899 Other long term (current) drug therapy: Secondary | ICD-10-CM | POA: Insufficient documentation

## 2011-11-05 DIAGNOSIS — T8131XA Disruption of external operation (surgical) wound, not elsewhere classified, initial encounter: Secondary | ICD-10-CM | POA: Insufficient documentation

## 2011-11-05 MED ORDER — SULFAMETHOXAZOLE-TMP DS 800-160 MG PO TABS
1.0000 | ORAL_TABLET | Freq: Once | ORAL | Status: AC
  Administered 2011-11-05: 1 via ORAL
  Filled 2011-11-05: qty 1

## 2011-11-05 MED ORDER — SULFAMETHOXAZOLE-TRIMETHOPRIM 800-160 MG PO TABS: 1.0000 | ORAL_TABLET | Freq: Two times a day (BID) | ORAL | Status: AC

## 2011-11-05 MED ORDER — OXYCODONE-ACETAMINOPHEN 5-325 MG PO TABS
1.0000 | ORAL_TABLET | Freq: Once | ORAL | Status: AC
  Administered 2011-11-05: 1 via ORAL
  Filled 2011-11-05: qty 1

## 2011-11-05 MED ORDER — OXYCODONE-ACETAMINOPHEN 5-325 MG PO TABS: 1.0000 | ORAL_TABLET | ORAL | Status: AC | PRN

## 2011-11-05 NOTE — ED Provider Notes (Signed)
History     CSN: [id number]  Arrival date & time 11/05/11  0818   First MD Initiated Contact with Patient 11/05/11 [phone]      Chief Complaint  Patient presents with  . Hand Pain    (Consider location/radiation/quality/duration/timing/severity/associated sxs/prior treatment) Patient is a 51 y.o. female presenting with hand pain. The history is provided by the patient.  Hand Pain The current episode started 1 to 4 weeks ago. The problem occurs constantly. The problem has been gradually worsening. Pertinent negatives include no chills or fever. Associated symptoms comments: She reports that Dr. Marikay Alar performed carpal tunnel release surgery on 10/16/11 with stitches removed by him the following week. She hit the area x 2 during that same week and reports the incision opened and the area has swollen intermittently since that time. Increased pain and persistent swelling for the past 5 days. No fever. She has not attempted to contact surgeon.. Treatments tried: She has cleaned wound daily and applied topical antibiotics with temporary relief. Now swelling and pain continue.    Past Medical History  Diagnosis Date  . Hypertension   . Hyperthyroidism   . Arthritis     Past Surgical History  Procedure Date  . Hernia repair AS INFANT   . Thyroid surgery 3/04  AND 3/85   . Abdominal hysterectomy     1983  . Knee surgery     RIGHT   . Hammer toe surgery  2002    RIGHT  SIDE   . Neck surgery 2011  . Back surgery 2009  . Neck surgery   . Ovary surgery 1992    CYST REMOVED   . Anterior cervical decomp/discectomy fusion 07/12/2011    Procedure: ANTERIOR CERVICAL DECOMPRESSION/DISCECTOMY FUSION 2 LEVELS;  Surgeon: Tia Alert;  Location: [facility];  Service: Neurosurgery;  Laterality: N/A;  Cervical five-six, six-seven anterior cervical decompression/discectomy fusion with removal of hardware from Cervical four-five    No family history on file.  History  Substance Use Topics    . Smoking status: Current Everyday Smoker -- 0.5 packs/day  . Smokeless tobacco: Not on file  . Alcohol Use: Yes    OB History    Grav Para Term Preterm Abortions TAB SAB Ect Mult Living                  Review of Systems  Constitutional: Negative for fever and chills.  HENT: Negative.   Respiratory: Negative.   Cardiovascular: Negative.   Gastrointestinal: Negative.   Musculoskeletal:       See HPI.  Skin:       Wound dehiscence right wrist.  Neurological: Negative.     Allergies  Penicillins and Penicillins cross reactors  Home Medications   Current Outpatient Rx  Name Route Sig Dispense Refill  . AMITRIPTYLINE HCL 75 MG PO TABS Oral Take by mouth at bedtime.    . ATENOLOL 50 MG PO TABS Oral Take 50 mg by mouth daily.      Marland Kitchen CALCIUM PO Oral Take 1 tablet by mouth daily.    . CYCLOBENZAPRINE HCL 10 MG PO TABS Oral Take 10 mg by mouth 3 (three) times daily.      Marland Kitchen ESTRADIOL 0.5 MG PO TABS Oral Take 0.5 mg by mouth daily.    Marland Kitchen GABAPENTIN 600 MG PO TABS Oral Take 600 mg by mouth 3 (three) times daily.      Marland Kitchen LEVOTHYROXINE SODIUM 100 MCG PO TABS Oral Take 100 mcg by  mouth daily.      . ADULT MULTIVITAMIN W/MINERALS CH Oral Take 1 tablet by mouth daily.      BP 134/78  Pulse 70  Temp(Src) 98.1 F (36.7 C) (Oral)  Resp 22  SpO2 97%  Physical Exam  Constitutional: She is oriented to person, place, and time. She appears well-developed and well-nourished.  Neck: Normal range of motion.  Pulmonary/Chest: Effort normal.  Musculoskeletal:       Right wrist swollen moderately. 3 cm linear surgical wound open without active drainage proximal palm of hand. FROM all digits with decreased sensation to middle finger and distal 4th finger. No digit swelling. There is no redness, or warmth.   Neurological: She is alert and oriented to person, place, and time.  Skin: Skin is warm and dry.    ED Course  Procedures (including critical care time)  Labs Reviewed - No data to  display No results found.   No diagnosis found.    MDM          Rodena Medin, PA-C 11/05/11 1003

## 2011-11-05 NOTE — Discharge Instructions (Signed)
IT IS IMPORTANT THAT YOU CONTACT DR. Yetta Barre OFFICE IN THE MORNING TO SCHEDULE A RECHECK APPOINTMENT FOR FURTHER CARE OF OPEN SURGICAL WOUND. KEEP CLEAN AND COVERED, APPLY TOPICAL ANTIBIOTICS. TAKE SEPTRA DS AS DIRECTED AND PERCOCET FOR PAIN - DO NOT TAKE ANY MORE HYDROCODONE WHILE TAKING PERCOCET.   Wound Dehiscence Wound dehiscence is when a surgical cut (incision) opens up. It usually happens 7 to 10 days after surgery. You may have pain, a fever, or have more fluid coming from the cut. It should be treated early. HOME CARE  Only take medicines as told by your doctor.   Take your medicines (antibiotics) as told. Finish them even if you start to feel better.   Wash your wound with warm, soapy water 2 times a day, or as told. Pat the wound dry. Do not rub the wound.   Change bandages (dressings) as often as told. Wash your hands before and after changing bandages. Apply bandages as told.   Take showers. Do not soak the wound, bathe, or swim until your wound is healed.   Avoid exercises that make you sweat.   Use medicines that stop itching as told by your doctor. The wound may itch as it heals. Do not pick or scratch at the wound.   Do not lift more than 10 pounds (4.5 kilograms) until the wound is healed, or as told by your doctor.   Keep all doctor visits as told.  GET HELP RIGHT AWAY IF:   You have more puffiness (swelling) or redness around the wound.   You have more pain in the wound.   You have yellowish white fluid (pus) coming from the wound.   More of the wound breaks open.   You have a fever.  MAKE SURE YOU:   Understand these instructions.   Will watch your condition.   Will get help right away if you are not doing well or get worse.  Document Released: 08/02/2009 Document Revised: 08/03/2011 Document Reviewed: 12/18/2010 [facility] Patient Information 2012 [place], [place].

## 2011-11-05 NOTE — ED Notes (Signed)
Taking hydrocodone for pain

## 2011-11-05 NOTE — ED Notes (Signed)
10/09/11: rt. Hand nerve surgery, carpal tunnel surgery. 10/16/11 - stitches removed. Inc. Swelling, Andres Escandon/clear drainage from wound site. Odor. Inc. Swelling, numbness to rt. Index finger and middle finger. Scared to call surgeon.

## 2011-11-06 NOTE — ED Provider Notes (Signed)
Medical screening examination/treatment/procedure(s) were performed by non-physician practitioner and as supervising physician I was immediately available for consultation/collaboration.   Suzi Roots, MD 11/06/11 [phone]

## 2011-11-14 ENCOUNTER — Ambulatory Visit
Admission: RE | Admit: 2011-11-14 | Discharge: 2011-11-14 | Disposition: A | Discharge status: Home / Self Care | Payer: Medicare Other | Source: Ambulatory Visit | Attending: Neurological Surgery | Admitting: Neurological Surgery

## 2011-11-14 ENCOUNTER — Other Ambulatory Visit: Payer: Self-pay | Admitting: Neurological Surgery

## 2011-11-14 DIAGNOSIS — G5600 Carpal tunnel syndrome, unspecified upper limb: Secondary | ICD-10-CM

## 2011-11-20 ENCOUNTER — Encounter (HOSPITAL_BASED_OUTPATIENT_CLINIC_OR_DEPARTMENT_OTHER): Payer: Medicare Other | Attending: Internal Medicine

## 2011-11-20 DIAGNOSIS — E890 Postprocedural hypothyroidism: Secondary | ICD-10-CM | POA: Insufficient documentation

## 2011-11-20 DIAGNOSIS — M519 Unspecified thoracic, thoracolumbar and lumbosacral intervertebral disc disorder: Secondary | ICD-10-CM | POA: Insufficient documentation

## 2011-11-20 DIAGNOSIS — F17200 Nicotine dependence, unspecified, uncomplicated: Secondary | ICD-10-CM | POA: Insufficient documentation

## 2011-11-20 DIAGNOSIS — Y838 Other surgical procedures as the cause of abnormal reaction of the patient, or of later complication, without mention of misadventure at the time of the procedure: Secondary | ICD-10-CM | POA: Insufficient documentation

## 2011-11-20 DIAGNOSIS — T8189XA Other complications of procedures, not elsewhere classified, initial encounter: Secondary | ICD-10-CM | POA: Insufficient documentation

## 2011-11-20 DIAGNOSIS — I10 Essential (primary) hypertension: Secondary | ICD-10-CM | POA: Insufficient documentation

## 2011-11-20 DIAGNOSIS — Z79899 Other long term (current) drug therapy: Secondary | ICD-10-CM | POA: Insufficient documentation

## 2011-11-20 NOTE — Progress Notes (Signed)
[facility]  NAME:  Katherine Russell, Katherine Russell              ACCOUNT NO.:  [id number]  MEDICAL RECORD NO.:  [id number]      DATE OF BIRTH:  [birth year]  PHYSICIAN:  Jonelle Sports. Jia Dottavio, M.D.  VISIT DATE:  11/20/2011                                  OFFICE VISIT   This 51 year old black female was seen for assistance with management of an unhealing surgical wound at the site of a recent right carpal tunnel repair.  The patient has several medical issues, all of which were stable at the moment.  These include hypertension, surgical hypothyroidism, some neurological problems in the lower extremity secondary to lumbar disk disease, and also some persisting problems in the cervical spine following surgery there for a pinched nerve.  She has also in the past had abdominal hernia repair and left knee surgery, thyroidectomy as previously mentioned.  Her regular medicines include estradiol, gabapentin, Flexeril, levothyroxine, atenolol, amitriptyline, meloxicam, doxycycline, hydrocodone acetate, multivitamins, and calcium.  The doxycycline was added with the failure of the surgical wound healing, and she has been taking that religiously twice daily.  Her surgery incidentally was on October 09, 2011, which would be approximately 6 weeks ago today.  While there is nothing reported that would suggest that she had infection in this wound.  She apparently was placed prophylactically on doxycycline.  She has been able to take this with adequate tolerance. She reports modest pain in the wound, but this is no worse than it had been and also she has noticed no drainage, spreading erythema, or systemic symptoms other than the pain related to her wrist wound.  The patient is a smoker and is once again encouraged by [place] here at this clinic to discontinue that as being less than desirable in the face of several of her illnesses but particularly wound healing.  PHYSICAL EXAMINATION:  VITAL  SIGNS:  Blood pressure 147/92, pulse 63 and regular, respirations 16, temperature 98.1.  She is 5 feet 6 inches tall, weighs 141 pounds. GENERAL:  She is alert and cooperative, somewhat over reactive. SKIN:  Warm and dry with good texture and turgor.  Her right hand is somewhat swollen as would be expected postoperatively and with limited motion that she has used since that time.  Her left wrist is also in a carpal tunnel type brace but this apparently is for some problem on the dorsum of the wrist rather than the same thing there.  On her wrist volar aspect is an open wound measuring 1.5 x 0.2 x 0.5 cm in depth, which has a callused scar tissue like material at its margins and some slight slough at its base.  There is no drainage, no odor, no erythema, nothing that is clinically suspicious for infection at this point.  The impression is that of a unhealing wound that likely will require some debridement of its margins prior to it being able to heal.  DISPOSITION:  We have today debrided this wound of some of the scar tissue and callus at its margins and some of the slough a bit deeper in the wound.  It did bleed minimally which is considered a satisfactory finding.  After hemostasis was obtained, the wound was packed with a small amount of collagen saturated with Hydrogel, and the wound was  then pulled together through the use of Steri-Strips and covered with a foam dressing.  The patient is advised to leave this in place for a week to avoid getting it dirty or wet, to continue her antibiotics as at present and to call us if there should be any interim problems.  Otherwise, she will be seen again in 1 week for further evaluation and therapy.          ______________________________ Jonelle Sports. Cheryll Cockayne, M.D.     RES/MEDQ  D:  11/20/2011  T:  11/20/2011  Job:  [id number]  cc:   Tia Alert, MD

## 2011-11-27 ENCOUNTER — Encounter (HOSPITAL_BASED_OUTPATIENT_CLINIC_OR_DEPARTMENT_OTHER): Payer: Medicare Other | Attending: Internal Medicine

## 2011-11-27 DIAGNOSIS — T8189XA Other complications of procedures, not elsewhere classified, initial encounter: Secondary | ICD-10-CM | POA: Insufficient documentation

## 2011-11-27 DIAGNOSIS — Z79899 Other long term (current) drug therapy: Secondary | ICD-10-CM | POA: Insufficient documentation

## 2011-11-27 DIAGNOSIS — Y838 Other surgical procedures as the cause of abnormal reaction of the patient, or of later complication, without mention of misadventure at the time of the procedure: Secondary | ICD-10-CM | POA: Insufficient documentation

## 2011-11-27 DIAGNOSIS — I10 Essential (primary) hypertension: Secondary | ICD-10-CM | POA: Insufficient documentation

## 2011-12-18 ENCOUNTER — Encounter (HOSPITAL_BASED_OUTPATIENT_CLINIC_OR_DEPARTMENT_OTHER): Payer: Medicare Other

## 2012-02-08 ENCOUNTER — Other Ambulatory Visit: Payer: Self-pay | Admitting: Family Medicine

## 2012-02-08 DIAGNOSIS — Z1231 Encounter for screening mammogram for malignant neoplasm of breast: Secondary | ICD-10-CM

## 2012-02-23 ENCOUNTER — Ambulatory Visit: Payer: Medicare Other

## 2012-06-12 ENCOUNTER — Other Ambulatory Visit: Payer: Self-pay | Admitting: Neurological Surgery

## 2012-07-10 ENCOUNTER — Other Ambulatory Visit: Payer: Self-pay | Admitting: Neurological Surgery

## 2012-07-18 ENCOUNTER — Other Ambulatory Visit: Payer: Self-pay | Admitting: Neurological Surgery

## 2012-07-23 ENCOUNTER — Ambulatory Visit: Payer: Medicare Other

## 2012-07-26 ENCOUNTER — Other Ambulatory Visit: Payer: Self-pay | Admitting: Family Medicine

## 2012-07-26 ENCOUNTER — Ambulatory Visit
Admission: RE | Admit: 2012-07-26 | Discharge: 2012-07-26 | Disposition: A | Discharge status: Home / Self Care | Payer: Medicare Other | Source: Ambulatory Visit | Attending: Family Medicine | Admitting: Family Medicine

## 2012-07-26 DIAGNOSIS — Z1231 Encounter for screening mammogram for malignant neoplasm of breast: Secondary | ICD-10-CM

## 2012-07-26 DIAGNOSIS — N6310 Unspecified lump in the right breast, unspecified quadrant: Secondary | ICD-10-CM

## 2012-08-09 ENCOUNTER — Other Ambulatory Visit: Payer: Self-pay | Admitting: Neurological Surgery

## 2012-08-18 ENCOUNTER — Other Ambulatory Visit: Payer: Self-pay | Admitting: Neurological Surgery

## 2012-09-06 ENCOUNTER — Other Ambulatory Visit: Payer: Self-pay | Admitting: Neurological Surgery

## 2012-09-18 ENCOUNTER — Other Ambulatory Visit: Payer: Self-pay | Admitting: Neurological Surgery

## 2012-09-25 ENCOUNTER — Other Ambulatory Visit: Payer: Self-pay | Admitting: Neurological Surgery

## 2012-10-16 ENCOUNTER — Other Ambulatory Visit: Payer: Self-pay | Admitting: Neurological Surgery

## 2013-03-05 ENCOUNTER — Other Ambulatory Visit: Payer: Self-pay | Admitting: Neurological Surgery

## 2013-03-05 DIAGNOSIS — M542 Cervicalgia: Secondary | ICD-10-CM

## 2013-03-08 ENCOUNTER — Encounter (HOSPITAL_COMMUNITY): Payer: Self-pay | Admitting: *Deleted

## 2013-03-08 ENCOUNTER — Emergency Department (HOSPITAL_COMMUNITY)
Admission: EM | Admit: 2013-03-08 | Discharge: 2013-03-08 | Disposition: A | Discharge status: Home / Self Care | Payer: No Typology Code available for payment source | Attending: Emergency Medicine | Admitting: Emergency Medicine

## 2013-03-08 ENCOUNTER — Emergency Department (HOSPITAL_COMMUNITY): Payer: No Typology Code available for payment source

## 2013-03-08 DIAGNOSIS — F17200 Nicotine dependence, unspecified, uncomplicated: Secondary | ICD-10-CM | POA: Insufficient documentation

## 2013-03-08 DIAGNOSIS — IMO0002 Reserved for concepts with insufficient information to code with codable children: Secondary | ICD-10-CM | POA: Insufficient documentation

## 2013-03-08 DIAGNOSIS — G8929 Other chronic pain: Secondary | ICD-10-CM | POA: Insufficient documentation

## 2013-03-08 DIAGNOSIS — M129 Arthropathy, unspecified: Secondary | ICD-10-CM | POA: Insufficient documentation

## 2013-03-08 DIAGNOSIS — E0590 Thyrotoxicosis, unspecified without thyrotoxic crisis or storm: Secondary | ICD-10-CM | POA: Insufficient documentation

## 2013-03-08 DIAGNOSIS — S99929A Unspecified injury of unspecified foot, initial encounter: Secondary | ICD-10-CM | POA: Insufficient documentation

## 2013-03-08 DIAGNOSIS — Y92410 Unspecified street and highway as the place of occurrence of the external cause: Secondary | ICD-10-CM | POA: Insufficient documentation

## 2013-03-08 DIAGNOSIS — S8990XA Unspecified injury of unspecified lower leg, initial encounter: Secondary | ICD-10-CM | POA: Insufficient documentation

## 2013-03-08 DIAGNOSIS — Z79899 Other long term (current) drug therapy: Secondary | ICD-10-CM | POA: Insufficient documentation

## 2013-03-08 DIAGNOSIS — M549 Dorsalgia, unspecified: Secondary | ICD-10-CM

## 2013-03-08 DIAGNOSIS — Y9389 Activity, other specified: Secondary | ICD-10-CM | POA: Insufficient documentation

## 2013-03-08 DIAGNOSIS — Z880 Allergy status to penicillin: Secondary | ICD-10-CM | POA: Insufficient documentation

## 2013-03-08 DIAGNOSIS — S79919A Unspecified injury of unspecified hip, initial encounter: Secondary | ICD-10-CM | POA: Insufficient documentation

## 2013-03-08 DIAGNOSIS — I10 Essential (primary) hypertension: Secondary | ICD-10-CM | POA: Insufficient documentation

## 2013-03-08 DIAGNOSIS — S0993XA Unspecified injury of face, initial encounter: Secondary | ICD-10-CM | POA: Insufficient documentation

## 2013-03-08 DIAGNOSIS — Z981 Arthrodesis status: Secondary | ICD-10-CM | POA: Insufficient documentation

## 2013-03-08 DIAGNOSIS — M25551 Pain in right hip: Secondary | ICD-10-CM

## 2013-03-08 DIAGNOSIS — Z9889 Other specified postprocedural states: Secondary | ICD-10-CM | POA: Insufficient documentation

## 2013-03-08 DIAGNOSIS — M25561 Pain in right knee: Secondary | ICD-10-CM

## 2013-03-08 HISTORY — DX: Other chronic pain: G89.29

## 2013-03-08 HISTORY — DX: Dorsalgia, unspecified: M54.9

## 2013-03-08 MED ORDER — OXYCODONE-ACETAMINOPHEN 5-325 MG PO TABS: ORAL_TABLET | ORAL | Status: DC

## 2013-03-08 MED ORDER — OXYCODONE-ACETAMINOPHEN 5-325 MG PO TABS
2.0000 | ORAL_TABLET | Freq: Once | ORAL | Status: AC
  Administered 2013-03-08: 2 via ORAL
  Filled 2013-03-08: qty 2

## 2013-03-08 MED ORDER — DIAZEPAM 5 MG PO TABS
10.0000 mg | ORAL_TABLET | Freq: Once | ORAL | Status: AC
  Administered 2013-03-08: 10 mg via ORAL
  Filled 2013-03-08: qty 2
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
## 2013-03-08 NOTE — ED Notes (Signed)
Patient transported to X-ray 
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
## 2013-03-08 NOTE — ED Provider Notes (Signed)
Medical screening examination/treatment/procedure(s) were performed by non-physician practitioner and as supervising physician I was immediately available for consultation/collaboration.  Flint Melter, MD 03/08/13 2049

## 2013-03-19 ENCOUNTER — Other Ambulatory Visit: Payer: Self-pay | Admitting: Neurological Surgery

## 2013-03-19 DIAGNOSIS — M542 Cervicalgia: Secondary | ICD-10-CM

## 2013-03-19 DIAGNOSIS — M549 Dorsalgia, unspecified: Secondary | ICD-10-CM

## 2013-03-21 ENCOUNTER — Other Ambulatory Visit: Payer: Medicare Other

## 2013-03-21 ENCOUNTER — Ambulatory Visit
Admission: RE | Admit: 2013-03-21 | Discharge: 2013-03-21 | Disposition: A | Discharge status: Home / Self Care | Payer: Medicare Other | Source: Ambulatory Visit | Attending: Neurological Surgery | Admitting: Neurological Surgery

## 2013-03-21 DIAGNOSIS — M542 Cervicalgia: Secondary | ICD-10-CM

## 2013-03-21 DIAGNOSIS — M549 Dorsalgia, unspecified: Secondary | ICD-10-CM

## 2013-05-19 ENCOUNTER — Other Ambulatory Visit: Payer: Self-pay | Admitting: Family Medicine

## 2013-05-19 ENCOUNTER — Other Ambulatory Visit (HOSPITAL_COMMUNITY)
Admission: RE | Admit: 2013-05-19 | Discharge: 2013-05-19 | Disposition: A | Discharge status: Home / Self Care | Payer: Medicare Other | Source: Ambulatory Visit | Attending: Family Medicine | Admitting: Family Medicine

## 2013-05-19 DIAGNOSIS — Z124 Encounter for screening for malignant neoplasm of cervix: Secondary | ICD-10-CM | POA: Insufficient documentation

## 2013-05-30 ENCOUNTER — Ambulatory Visit: Payer: Medicare Other | Attending: Family Medicine

## 2013-08-03 ENCOUNTER — Emergency Department (HOSPITAL_COMMUNITY)
Admission: EM | Admit: 2013-08-03 | Discharge: 2013-08-03 | Disposition: A | Discharge status: Home / Self Care | Payer: Medicare Other | Attending: Emergency Medicine | Admitting: Emergency Medicine

## 2013-08-03 ENCOUNTER — Encounter (HOSPITAL_COMMUNITY): Payer: Self-pay | Admitting: Emergency Medicine

## 2013-08-03 DIAGNOSIS — Z79899 Other long term (current) drug therapy: Secondary | ICD-10-CM | POA: Insufficient documentation

## 2013-08-03 DIAGNOSIS — M542 Cervicalgia: Secondary | ICD-10-CM

## 2013-08-03 DIAGNOSIS — M549 Dorsalgia, unspecified: Secondary | ICD-10-CM

## 2013-08-03 DIAGNOSIS — M129 Arthropathy, unspecified: Secondary | ICD-10-CM | POA: Insufficient documentation

## 2013-08-03 DIAGNOSIS — G8929 Other chronic pain: Secondary | ICD-10-CM | POA: Insufficient documentation

## 2013-08-03 DIAGNOSIS — I10 Essential (primary) hypertension: Secondary | ICD-10-CM | POA: Insufficient documentation

## 2013-08-03 DIAGNOSIS — E0590 Thyrotoxicosis, unspecified without thyrotoxic crisis or storm: Secondary | ICD-10-CM | POA: Insufficient documentation

## 2013-08-03 DIAGNOSIS — F17200 Nicotine dependence, unspecified, uncomplicated: Secondary | ICD-10-CM | POA: Insufficient documentation

## 2013-08-03 DIAGNOSIS — Z880 Allergy status to penicillin: Secondary | ICD-10-CM | POA: Insufficient documentation

## 2013-08-03 MED ORDER — TETANUS-DIPHTH-ACELL PERTUSSIS 5-2.5-18.5 LF-MCG/0.5 IM SUSP: 0.5000 mL | Freq: Once | INTRAMUSCULAR | Status: DC

## 2013-08-03 MED ORDER — OXYCODONE-ACETAMINOPHEN 5-325 MG PO TABS: 1.0000 | ORAL_TABLET | Freq: Once | ORAL | Status: DC

## 2013-08-03 MED ORDER — OXYCODONE-ACETAMINOPHEN 5-325 MG PO TABS: 1.0000 | ORAL_TABLET | Freq: Four times a day (QID) | ORAL | Status: DC | PRN

## 2013-08-03 MED ORDER — HYDROMORPHONE HCL PF 1 MG/ML IJ SOLN
1.0000 mg | Freq: Once | INTRAMUSCULAR | Status: AC
  Administered 2013-08-03: 1 mg via INTRAMUSCULAR
  Filled 2013-08-03: qty 1

## 2013-08-03 NOTE — ED Notes (Signed)
Pt reports having pain to entire left side of body, having pain to left neck, left shoulder, back, hip and leg. Hx of back pain and surgery, denies new injury.

## 2013-08-03 NOTE — ED Provider Notes (Signed)
CSN: [id number]     Arrival date & time 08/03/13  1117 History   First MD Initiated Contact with Patient 08/03/13 1206     Chief Complaint  Patient presents with  . Neck Pain  . Back Pain   (Consider location/radiation/quality/duration/timing/severity/associated sxs/prior Treatment) HPI Pt with hx of prior neck and back surgery with chronic back pain presenting with a worsening of her left sided neck and back pain.  Pt states pain has been ongoing for the past several days.  Worse with movement, palpation, and certain positions.  No fever/chills.  No new injuries or activities.  No leg weakness, no urinary retention or incontinence of bowel or bladder.  Pt has been taking mobic, gapapentin and tylenol without much relief.  There are no other associated systemic symptoms, there are no other alleviating or modifying factors.   Past Medical History  Diagnosis Date  . Hypertension   . Hyperthyroidism   . Arthritis   . Chronic back pain    Past Surgical History  Procedure Laterality Date  . Hernia repair  AS INFANT   . Thyroid surgery  3/04  AND 3/85   . Abdominal hysterectomy      1983  . Knee surgery      RIGHT   . Hammer toe surgery   2002    RIGHT  SIDE   . Neck surgery  2011  . Back surgery  2009  . Neck surgery    . Ovary surgery  1992    CYST REMOVED   . Anterior cervical decomp/discectomy fusion  07/12/2011    Procedure: ANTERIOR CERVICAL DECOMPRESSION/DISCECTOMY FUSION 2 LEVELS;  Surgeon: Tia Alert;  Location: [facility];  Service: Neurosurgery;  Laterality: N/A;  Cervical five-six, six-seven anterior cervical decompression/discectomy fusion with removal of hardware from Cervical four-five   History reviewed. No pertinent family history. History  Substance Use Topics  . Smoking status: Current Every Day Smoker -- 0.50 packs/day  . Smokeless tobacco: Not on file  . Alcohol Use: Yes   OB History   Grav Para Term Preterm Abortions TAB SAB Ect Mult Living         Review of Systems ROS reviewed and all otherwise negative except for mentioned in HPI  Allergies  Penicillins and Penicillins cross reactors  Home Medications   Current Outpatient Rx  Name  Route  Sig  Dispense  Refill  . amitriptyline (ELAVIL) 75 MG tablet   Oral   Take 75 mg by mouth at bedtime.          Marland Kitchen atenolol (TENORMIN) 50 MG tablet   Oral   Take 50 mg by mouth daily.           . cyclobenzaprine (FLEXERIL) 10 MG tablet   Oral   Take 10 mg by mouth 3 (three) times daily.           Marland Kitchen estradiol (ESTRACE) 0.5 MG tablet   Oral   Take 0.5 mg by mouth daily.         Marland Kitchen gabapentin (NEURONTIN) 600 MG tablet   Oral   Take 600 mg by mouth 3 (three) times daily.           Marland Kitchen levothyroxine (SYNTHROID, LEVOTHROID) 100 MCG tablet   Oral   Take 100 mcg by mouth daily.           . Multiple Vitamin (MULITIVITAMIN WITH MINERALS) TABS   Oral   Take 1 tablet by mouth daily.         Marland Kitchen  meloxicam (MOBIC) 7.5 MG tablet   Oral   Take 7.5 mg by mouth 2 (two) times daily.          Marland Kitchen oxyCODONE-acetaminophen (PERCOCET/ROXICET) 5-325 MG per tablet   Oral   Take 1-2 tablets by mouth every 6 (six) hours as needed for severe pain.   15 tablet   0    BP 154/92  Pulse 66  Temp(Src) 98.7 F (37.1 C) (Oral)  Resp 12  Wt 141 lb (63.957 kg)  SpO2 99% Vitals reviewed Physical Exam Physical Examination: General appearance - alert, well appearing, and in no distress Mental status - alert, oriented to person, place, and time Eyes -  No conjunctival injection, no scleral icterus Mouth - mucous membranes moist, pharynx normal without lesions Neck - no midline tenderness, ttp over left SCM and left paraspinous muscles, well healed midline surgical incision Chest - clear to auscultation, no wheezes, rales or rhonchi, symmetric air entry Heart - normal rate, regular rhythm, normal S1, S2, no murmurs, rubs, clicks or gallops Abdomen - soft, nontender, nondistended, no  masses or organomegaly Back exam - no ttp over c/t/l spine, ttp over left paraspinous muscles, no CVA tenderness Extremities - peripheral pulses normal, no pedal edema, no clubbing or cyanosis Skin - normal coloration and turgor, no rashes Neuro- strength 5/5 in extremities x 4, sensation intact, alert and oriented x 3  ED Course  Procedures (including critical care time) Labs Review Labs Reviewed - No data to display Imaging Review No results found.  EKG Interpretation   None       MDM   1. Neck pain   2. Back pain    Pt presenting with c/o worsening of her chronic neck and back pain.  No new injury or trauma.  No midline tenderness to palpation, no neurologic symptoms or signs.  Pt treated with IM meds in the ED.  Will give rx for pain meds for home use, and encouraged f/u with her PMD.  Discharged with strict return precautions.  Pt agreeable with plan.    Ethelda Chick, MD 08/03/13 [phone]

## 2013-10-30 ENCOUNTER — Emergency Department (HOSPITAL_COMMUNITY): Payer: No Typology Code available for payment source

## 2013-10-30 ENCOUNTER — Encounter (HOSPITAL_COMMUNITY): Payer: Self-pay | Admitting: Emergency Medicine

## 2013-10-30 ENCOUNTER — Emergency Department (HOSPITAL_COMMUNITY)
Admission: EM | Admit: 2013-10-30 | Discharge: 2013-10-30 | Disposition: A | Discharge status: Home / Self Care | Payer: No Typology Code available for payment source | Attending: Emergency Medicine | Admitting: Emergency Medicine

## 2013-10-30 DIAGNOSIS — G8929 Other chronic pain: Secondary | ICD-10-CM | POA: Diagnosis not present

## 2013-10-30 DIAGNOSIS — M545 Low back pain, unspecified: Secondary | ICD-10-CM

## 2013-10-30 DIAGNOSIS — Z880 Allergy status to penicillin: Secondary | ICD-10-CM | POA: Diagnosis not present

## 2013-10-30 DIAGNOSIS — M129 Arthropathy, unspecified: Secondary | ICD-10-CM | POA: Diagnosis not present

## 2013-10-30 DIAGNOSIS — Y9389 Activity, other specified: Secondary | ICD-10-CM | POA: Diagnosis not present

## 2013-10-30 DIAGNOSIS — Z79899 Other long term (current) drug therapy: Secondary | ICD-10-CM | POA: Diagnosis not present

## 2013-10-30 DIAGNOSIS — M25512 Pain in left shoulder: Secondary | ICD-10-CM

## 2013-10-30 DIAGNOSIS — IMO0002 Reserved for concepts with insufficient information to code with codable children: Secondary | ICD-10-CM | POA: Insufficient documentation

## 2013-10-30 DIAGNOSIS — Z791 Long term (current) use of non-steroidal anti-inflammatories (NSAID): Secondary | ICD-10-CM | POA: Insufficient documentation

## 2013-10-30 DIAGNOSIS — Y92410 Unspecified street and highway as the place of occurrence of the external cause: Secondary | ICD-10-CM | POA: Diagnosis not present

## 2013-10-30 DIAGNOSIS — S4980XA Other specified injuries of shoulder and upper arm, unspecified arm, initial encounter: Secondary | ICD-10-CM | POA: Insufficient documentation

## 2013-10-30 DIAGNOSIS — R011 Cardiac murmur, unspecified: Secondary | ICD-10-CM | POA: Diagnosis not present

## 2013-10-30 DIAGNOSIS — S0993XA Unspecified injury of face, initial encounter: Secondary | ICD-10-CM | POA: Diagnosis not present

## 2013-10-30 DIAGNOSIS — F17200 Nicotine dependence, unspecified, uncomplicated: Secondary | ICD-10-CM | POA: Insufficient documentation

## 2013-10-30 DIAGNOSIS — E0590 Thyrotoxicosis, unspecified without thyrotoxic crisis or storm: Secondary | ICD-10-CM | POA: Insufficient documentation

## 2013-10-30 DIAGNOSIS — M542 Cervicalgia: Secondary | ICD-10-CM

## 2013-10-30 DIAGNOSIS — M79605 Pain in left leg: Secondary | ICD-10-CM

## 2013-10-30 DIAGNOSIS — S199XXA Unspecified injury of neck, initial encounter: Secondary | ICD-10-CM

## 2013-10-30 DIAGNOSIS — S46909A Unspecified injury of unspecified muscle, fascia and tendon at shoulder and upper arm level, unspecified arm, initial encounter: Secondary | ICD-10-CM | POA: Diagnosis present

## 2013-10-30 DIAGNOSIS — I10 Essential (primary) hypertension: Secondary | ICD-10-CM | POA: Insufficient documentation

## 2013-10-30 MED ORDER — OXYCODONE-ACETAMINOPHEN 5-325 MG PO TABS
2.0000 | ORAL_TABLET | Freq: Once | ORAL | Status: AC
  Administered 2013-10-30: 2 via ORAL
  Filled 2013-10-30: qty 2

## 2013-10-30 MED ORDER — OXYCODONE-ACETAMINOPHEN 5-325 MG PO TABS: 1.0000 | ORAL_TABLET | ORAL | Status: DC | PRN
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
## 2013-10-30 NOTE — ED Notes (Signed)
Patient transported to X-ray 

## 2013-10-30 NOTE — ED Notes (Signed)
Pt now c/o Left hand and finger tingling

## 2013-10-30 NOTE — ED Notes (Signed)
Pt also reports Left shoulder and lower back pain

## 2013-10-30 NOTE — Discharge Instructions (Signed)
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 Wednesday afternoon from 1:00 pm until 4:00 pm. You do not need an appointment for the interview!  °[facility] [address], [place], [place] 336-631-2330   °[facility]  336-342-8273   °[facility]  336-703-3100   ° [facility]  336-570-6415   ° °Behavioral Health Resources in the Community: °Intensive Outpatient  Programs °Organization         Address  Phone  Notes  °[facility] [address], [place], [place] 336-878-6098   °[facility] [address], [place], [place] 336-832-9800   °[facility] [address], [place], [place] ° 336-882-2125   °[facility] [address],  °[place], [place] 1-800-853-5163 or 336-641-4981   °Substance Abuse Resources °Organization         Address  Phone  Notes  °[facility]  336-882-2125   °[facility]  336-784-9470   °[facility]  336-285-9073   °[facility]  336-845-3988   °Residential & Outpatient Substance Abuse Program  1-800-659-3381   °Psychological Services °Organization         Address  Phone  Notes  °[facility]  336- 832-9600   °[facility]  336- 378-7881   °[facility] [address], [place] 1-800-853-5163 or 336-641-4981   ° °Mobile Crisis Teams °Organization         Address  Phone  Notes  °[facility], Mobile Crisis Care Unit  1-877-626-1772   °Assertive °[facility] ° [address] [place], [place] 336-834-9664   °Sharon DeEsch [address] °[facility] 336-554-5454   ° °Self-Help/Support Groups °Organization         Address  Phone             Notes  °[facility] - variety of support groups  336- 373-1402 Call for more information  °Narcotics Anonymous (NA), [facility] [address], °[place]  2 meetings at this location  ° °Residential Treatment Programs °Organization         Address  Phone  Notes  °[facility] [address],    °[place]  1-866-801-8205   °[facility] ° [address], [place], [place] 704-293-8524   °[facility] [address], [place] 336-845-3988 Admissions: 8am-3pm M-F  °[facility] [address]    °[place], [place]  336-841-1104   °[facility] [address], [place], [place] 336-379-7146   °[facility] [address]  °[place], [place] 336-285-9073   °[facility] - Intensive Outpatient [address], [place],  336-852-3033   °[facility] [address]  °[place], [place] 1-877-615-2722 or 336-784-9470   °[facility] [address] [place], [place] 336-227-7417 Accepts Medicaid  °[facility] [address]  °[place] 1-800-659-3381 Substance Abuse/Addiction Treatment  ° °[place] [facility] °Organization         Address  Phone  Notes  °[facility]  (888) 581-9988   °Julie Brannon, PhD [address] [place], [place]   (336) 349-5553 or (336) 951-0000   ° Behavioral   [address] °[place], [place] (336) 349-4454   °[facility] [address], [place], [place] (336) 342-8316 Insurance/Medicaid/sponsorship through [facility]  °[facility] [address]                                    [place], [place] (336) 342-8316 Therapy/tele-psych/case  °[facility] [address]  ° [place], [place] (336) 349-2233    °Dr. Arfeen  (336) 349-4544   °[facility]  [employer] [facility] 1) [address],  °2) [address], [place] °3)  [address], [place] (336) 349-3220 °(336) 342-7768 ° °(336) 342-8140   °[place] Child Abuse [facility] (336) 342-1394 or (336) 342-3537 (After Hours)    ° ° ° °

## 2013-10-31 ENCOUNTER — Other Ambulatory Visit (HOSPITAL_COMMUNITY): Payer: Self-pay | Admitting: Family Medicine

## 2013-10-31 DIAGNOSIS — Z1231 Encounter for screening mammogram for malignant neoplasm of breast: Secondary | ICD-10-CM

## 2013-11-05 NOTE — ED Provider Notes (Signed)
Medical screening examination/treatment/procedure(s) were performed by non-physician practitioner and as supervising physician I was immediately available for consultation/collaboration.   EKG Interpretation None        Christopher J. Pollina, MD 11/05/13 1526 

## 2013-11-28 ENCOUNTER — Ambulatory Visit (HOSPITAL_COMMUNITY)
Admission: RE | Admit: 2013-11-28 | Discharge: 2013-11-28 | Disposition: A | Discharge status: Home / Self Care | Payer: Medicare Other | Source: Ambulatory Visit | Attending: Family Medicine | Admitting: Family Medicine

## 2013-11-28 DIAGNOSIS — Z1231 Encounter for screening mammogram for malignant neoplasm of breast: Secondary | ICD-10-CM | POA: Insufficient documentation

## 2015-06-26 ENCOUNTER — Emergency Department (HOSPITAL_COMMUNITY)
Admission: EM | Admit: 2015-06-26 | Discharge: 2015-06-26 | Disposition: A | Discharge status: Home / Self Care | Payer: Medicare Other | Attending: Emergency Medicine | Admitting: Emergency Medicine

## 2015-06-26 ENCOUNTER — Encounter (HOSPITAL_COMMUNITY): Payer: Self-pay

## 2015-06-26 ENCOUNTER — Emergency Department (HOSPITAL_COMMUNITY): Payer: Medicare Other

## 2015-06-26 DIAGNOSIS — G8929 Other chronic pain: Secondary | ICD-10-CM | POA: Insufficient documentation

## 2015-06-26 DIAGNOSIS — Z791 Long term (current) use of non-steroidal anti-inflammatories (NSAID): Secondary | ICD-10-CM | POA: Diagnosis not present

## 2015-06-26 DIAGNOSIS — Y9289 Other specified places as the place of occurrence of the external cause: Secondary | ICD-10-CM | POA: Diagnosis not present

## 2015-06-26 DIAGNOSIS — I10 Essential (primary) hypertension: Secondary | ICD-10-CM | POA: Diagnosis not present

## 2015-06-26 DIAGNOSIS — Z79899 Other long term (current) drug therapy: Secondary | ICD-10-CM | POA: Diagnosis not present

## 2015-06-26 DIAGNOSIS — Z720 Tobacco use: Secondary | ICD-10-CM | POA: Diagnosis not present

## 2015-06-26 DIAGNOSIS — Y9389 Activity, other specified: Secondary | ICD-10-CM | POA: Diagnosis not present

## 2015-06-26 DIAGNOSIS — Z880 Allergy status to penicillin: Secondary | ICD-10-CM | POA: Diagnosis not present

## 2015-06-26 DIAGNOSIS — S3992XA Unspecified injury of lower back, initial encounter: Secondary | ICD-10-CM | POA: Diagnosis present

## 2015-06-26 DIAGNOSIS — Y998 Other external cause status: Secondary | ICD-10-CM | POA: Diagnosis not present

## 2015-06-26 DIAGNOSIS — W010XXA Fall on same level from slipping, tripping and stumbling without subsequent striking against object, initial encounter: Secondary | ICD-10-CM | POA: Insufficient documentation

## 2015-06-26 DIAGNOSIS — M1990 Unspecified osteoarthritis, unspecified site: Secondary | ICD-10-CM | POA: Insufficient documentation

## 2015-06-26 DIAGNOSIS — M5441 Lumbago with sciatica, right side: Secondary | ICD-10-CM

## 2015-06-26 DIAGNOSIS — E039 Hypothyroidism, unspecified: Secondary | ICD-10-CM | POA: Insufficient documentation

## 2015-06-26 MED ORDER — DIAZEPAM 5 MG PO TABS
5.0000 mg | ORAL_TABLET | Freq: Once | ORAL | Status: AC
  Administered 2015-06-26: 5 mg via ORAL
  Filled 2015-06-26: qty 1

## 2015-06-26 MED ORDER — PREDNISONE 10 MG (21) PO TBPK: 10.0000 mg | ORAL_TABLET | Freq: Every day | ORAL | Status: DC

## 2015-06-26 MED ORDER — CYCLOBENZAPRINE HCL 10 MG PO TABS: 10.0000 mg | ORAL_TABLET | Freq: Two times a day (BID) | ORAL | Status: AC | PRN

## 2015-06-26 MED ORDER — NAPROXEN 250 MG PO TABS
500.0000 mg | ORAL_TABLET | Freq: Once | ORAL | Status: AC
  Administered 2015-06-26: 500 mg via ORAL
  Filled 2015-06-26: qty 2

## 2015-06-26 MED ORDER — NAPROXEN 500 MG PO TABS: 500.0000 mg | ORAL_TABLET | Freq: Two times a day (BID) | ORAL | Status: DC | PRN

## 2015-06-26 NOTE — Discharge Instructions (Signed)
Return without fail for worsening symptoms including loss of control of her bowel or bladder, new weakness or numbness, inability to walk, or any other symptoms concerning to you.

## 2015-06-26 NOTE — ED Provider Notes (Signed)
CSN: [id number]     Arrival date & time 06/26/15  5784 History   First MD Initiated Contact with Patient 06/26/15 [phone]     Chief Complaint  Patient presents with  . Back Pain     (Consider location/radiation/quality/duration/timing/severity/associated sxs/prior Treatment) HPI  54 year old female who presents with back pain. History of chronic low back pain and prior lumbar disc and cervical spine surgery. Has been in her usual state of health, and had a mechanical fall yesterday, tripping and falling onto her right side. Did not hit her head or have loss of consciousness. Was able to ambulate, but since has been having pain over the right side of her back. She reports that pain radiates down her right posterior thigh. It is not associated with numbness or weakness, inability to walk, loss of control of her bowel, urinary retention. Denies other injuries reported, and states that home medications are not helping..     Past Medical History  Diagnosis Date  . Hypertension   . Hyperthyroidism   . Arthritis   . Chronic back pain    Past Surgical History  Procedure Laterality Date  . Hernia repair  AS INFANT   . Thyroid surgery  3/04  AND 3/85   . Abdominal hysterectomy      1983  . Knee surgery      RIGHT   . Hammer toe surgery   2002    RIGHT  SIDE   . Neck surgery  2011  . Back surgery  2009  . Neck surgery    . Ovary surgery  1992    CYST REMOVED   . Anterior cervical decomp/discectomy fusion  07/12/2011    Procedure: ANTERIOR CERVICAL DECOMPRESSION/DISCECTOMY FUSION 2 LEVELS;  Surgeon: Tia Alert;  Location: [facility];  Service: Neurosurgery;  Laterality: N/A;  Cervical five-six, six-seven anterior cervical decompression/discectomy fusion with removal of hardware from Cervical four-five   History reviewed. No pertinent family history. Social History  Substance Use Topics  . Smoking status: Current Every Day Smoker -- 0.50 packs/day  . Smokeless tobacco: None  .  Alcohol Use: Yes     Comment: occ   OB History    No data available     Review of Systems  Cardiovascular: Negative for palpitations.  Gastrointestinal: Negative for nausea, vomiting and abdominal pain.  Genitourinary: Negative for difficulty urinating.  Skin: Negative for wound.  Allergic/Immunologic: Negative for immunocompromised state.  Neurological: Negative for weakness and numbness.  Hematological: Does not bruise/bleed easily.  All other systems reviewed and are negative.     Allergies  Penicillins and Penicillins cross reactors  Home Medications   Prior to Admission medications   Medication Sig Start Date End Date Taking? Authorizing Provider  amitriptyline (ELAVIL) 100 MG tablet Take 100 mg by mouth at bedtime.   Yes Historical Provider, MD  amLODipine (NORVASC) 10 MG tablet Take 10 mg by mouth daily.   Yes Historical Provider, MD  baclofen (LIORESAL) 10 MG tablet Take 10 mg by mouth 3 (three) times daily.   Yes Historical Provider, MD  gabapentin (NEURONTIN) 600 MG tablet Take 600 mg by mouth 3 (three) times daily.     Yes Historical Provider, MD  levothyroxine (SYNTHROID, LEVOTHROID) 100 MCG tablet Take 100 mcg by mouth daily.     Yes Historical Provider, MD  meloxicam (MOBIC) 7.5 MG tablet Take 7.5 mg by mouth 2 (two) times daily.    Yes Historical Provider, MD  Multiple Vitamin (MULITIVITAMIN WITH MINERALS)  TABS Take 1 tablet by mouth daily.   Yes Historical Provider, MD  pregabalin (LYRICA) 300 MG capsule Take 300 mg by mouth 2 (two) times daily.   Yes Historical Provider, MD  cyclobenzaprine (FLEXERIL) 10 MG tablet Take 1 tablet (10 mg total) by mouth 2 (two) times daily as needed for muscle spasms. 06/26/15   Lavera Guiseana Duo Karra Pink, MD  naproxen (NAPROSYN) 500 MG tablet Take 1 tablet (500 mg total) by mouth 2 (two) times daily as needed for moderate pain. 06/26/15   Lavera Guiseana Duo Kaesha Kirsch, MD  predniSONE (STERAPRED UNI-PAK 21 TAB) 10 MG (21) TBPK tablet Take 1 tablet (10 mg total)  by mouth daily. Take 6 tabs by mouth daily  for 2 days, then 5 tabs for 2 days, then 4 tabs for 2 days, then 3 tabs for 2 days, 2 tabs for 2 days, then 1 tab by mouth daily for 2 days 06/26/15   Lavera Guiseana Duo Mayte Diers, MD   BP 155/86 mmHg  Pulse 68  Temp(Src) 97.5 F (36.4 C) (Oral)  Resp 17  Ht 5' 6.75" (1.695 m)  Wt 140 lb (63.504 kg)  BMI 22.10 kg/m2  SpO2 95% Physical Exam Physical Exam  Nursing note and vitals reviewed. Constitutional: Well developed, well nourished, non-toxic, and in no acute distress Head: Normocephalic and atraumatic.  Mouth/Throat: Oropharynx is clear and moist.  Neck: Normal range of motion. Neck supple.  Cardiovascular: Normal rate and regular rhythm.   Pulmonary/Chest: Effort normal and breath sounds normal.  Abdominal: Soft. There is no tenderness. There is no rebound and no guarding.  no CVA tenderness.  Musculoskeletal: Normal range of motion.  tender to palpation in her low lumbar spine and right paraspinal muscles of her lumbar spine.  Neurological: Alert, no facial droop, fluent speech, . She has steady and non-ataxic gait. Full strength in hip flexion/extension, knee flexion and extension, ankle dorsi and plantar flexion. +2 patellar and Achilles reflexes bilaterally. Sensation to light touch intact bilaterally in lower extremities. Skin: Skin is warm and dry.  Psychiatric: Cooperative  ED Course  Procedures (including critical care time) Labs Review Labs Reviewed - No data to display  Imaging Review Dg Lumbar Spine Complete  06/26/2015  CLINICAL DATA:  Low back pain and left leg pain after a fall yesterday. EXAM: LUMBAR SPINE - COMPLETE 4+ VIEW COMPARISON:  10/30/2013 FINDINGS: There is no fracture or subluxation. Solid interbody and posterior fusion at L4-5. No disc space narrowing. Calcification in the abdominal aorta and common iliac arteries. IMPRESSION: No acute abnormality. Electronically Signed   By: Francene BoyersJames  Maxwell M.D.   On: 06/26/2015 10:48   I  have personally reviewed and evaluated these images and lab results as part of my medical decision-making.   MDM   Final diagnoses:  Right-sided low back pain with right-sided sciatica   54 year old female who presents with low back pain in setting of mechanical fall yesterday. She is well-appearing in no acute distress on presentation. Her vital signs are non-concerning. Reproducible back pain with palpation of her right paraspinal muscles, and she is neurologically intact. No concerning features on history or exam that would suggest spinal cord involvement or severe fractures. An x-ray performed of her low back shows no acute abnormalities. Patient is given anti-inflammatory medication and muscle relaxant, with some good relief. We discussed supportive care instructions for home. Strict return and follow-up instructions are reviewed. She expressed understanding of all discharge instructions for comfortable to plan of care.    Lavera Guiseana Duo Tatanisha Cuthbert,  MD 06/26/15 1538

## 2015-06-26 NOTE — ED Provider Notes (Signed)
CSN: [id number]     Arrival date & time 06/26/15  1324 History   First MD Initiated Contact with Patient 06/26/15 [phone]     Chief Complaint  Patient presents with  . Back Pain     (Consider location/radiation/quality/duration/timing/severity/associated sxs/prior Treatment) HPI 54 year old female who presents with back pain. History of chronic low back pain and prior lumbar spine surgery with hardware. States that she had a mechanical fall yesterday, tripping and falling onto her right side. She did not hit her head or have loss of consciousness. Since then, she has been having right-sided low back pain as well as pain radiating behind her right thigh. She denies any numbness or weakness, bowel or urinary incontinence, urinary retention, or perirectal numbness. Has otherwise been in her usual state of health. Denies fevers, chills, night sweats, unusual weight loss.   Past Medical History  Diagnosis Date  . Hypertension   . Hyperthyroidism   . Arthritis   . Chronic back pain    Past Surgical History  Procedure Laterality Date  . Hernia repair  AS INFANT   . Thyroid surgery  3/04  AND 3/85   . Abdominal hysterectomy      1983  . Knee surgery      RIGHT   . Hammer toe surgery   2002    RIGHT  SIDE   . Neck surgery  2011  . Back surgery  2009  . Neck surgery    . Ovary surgery  1992    CYST REMOVED   . Anterior cervical decomp/discectomy fusion  07/12/2011    Procedure: ANTERIOR CERVICAL DECOMPRESSION/DISCECTOMY FUSION 2 LEVELS;  Surgeon: Tia Alert;  Location: [facility];  Service: Neurosurgery;  Laterality: N/A;  Cervical five-six, six-seven anterior cervical decompression/discectomy fusion with removal of hardware from Cervical four-five   History reviewed. No pertinent family history. Social History  Substance Use Topics  . Smoking status: Current Every Day Smoker -- 0.50 packs/day  . Smokeless tobacco: None  . Alcohol Use: Yes     Comment: occ   OB History    No  data available     Review of Systems  Genitourinary: Negative for difficulty urinating.  Musculoskeletal: Positive for back pain. Negative for neck pain.  Skin: Negative for wound.  Allergic/Immunologic: Negative for immunocompromised state.  Neurological: Negative for weakness and numbness.  Hematological: Does not bruise/bleed easily.  All other systems reviewed and are negative.     Allergies  Penicillins and Penicillins cross reactors  Home Medications   Prior to Admission medications   Medication Sig Start Date End Date Taking? Authorizing Provider  amitriptyline (ELAVIL) 75 MG tablet Take 75 mg by mouth at bedtime.     Historical Provider, MD  atenolol (TENORMIN) 50 MG tablet Take 50 mg by mouth daily.      Historical Provider, MD  cyclobenzaprine (FLEXERIL) 10 MG tablet Take 10 mg by mouth 3 (three) times daily.      Historical Provider, MD  estradiol (ESTRACE) 0.5 MG tablet Take 0.5 mg by mouth daily.    Historical Provider, MD  gabapentin (NEURONTIN) 600 MG tablet Take 600 mg by mouth 3 (three) times daily.      Historical Provider, MD  levothyroxine (SYNTHROID, LEVOTHROID) 100 MCG tablet Take 100 mcg by mouth daily.      Historical Provider, MD  meloxicam (MOBIC) 7.5 MG tablet Take 7.5 mg by mouth 2 (two) times daily.     Historical Provider, MD  Multiple Vitamin (  MULITIVITAMIN WITH MINERALS) TABS Take 1 tablet by mouth daily.    Historical Provider, MD  oxyCODONE-acetaminophen (PERCOCET/ROXICET) 5-325 MG per tablet Take 1-2 tablets by mouth every 6 (six) hours as needed for severe pain. 08/03/13   Jerelyn ScottMartha Linker, MD  oxyCODONE-acetaminophen (PERCOCET/ROXICET) 5-325 MG per tablet Take 1-2 tablets by mouth every 4 (four) hours as needed for severe pain. 10/30/13   Janene HarveyJessica K Palmer, PA-C   BP 124/88 mmHg  Pulse 88  Temp(Src) 98.3 F (36.8 C) (Oral)  Resp 18  Ht 5' 6.75" (1.695 m)  Wt 140 lb (63.504 kg)  BMI 22.10 kg/m2  SpO2 98% Physical Exam Physical Exam  Nursing  note and vitals reviewed. Constitutional: Well developed, well nourished, non-toxic, and in no acute distress Head: Normocephalic and atraumatic.  Mouth/Throat: Oropharynx is clear and moist.  Neck: Normal range of motion. Neck supple.  Cardiovascular: Normal rate and regular rhythm.   Pulmonary/Chest: Effort normal and breath sounds normal.  Abdominal: Soft. There is no tenderness. There is no rebound and no guarding.  Musculoskeletal: Tender to palpation over lumbar spine as well as right paraspinal muscles. No deformities or step-offs.  Neurological: Alert, no facial droop, fluent speech +2 patellar and Achilles reflexes, sensation to light touch intact in bilateral lower extremities, full strength in hip flexion/extension/abduction/adduction, knee flexion and extension, and ankle dorsi/plantar flexion. Skin: Skin is warm and dry.  Psychiatric: Cooperative  ED Course  Procedures (including critical care time) Labs Review Labs Reviewed - No data to display  Imaging Review No results found. I have personally reviewed and evaluated these images and lab results as part of my medical decision-making.   MDM   Final diagnoses:  None    54 year old female with history of back surgery and chronic low back pain who presents with back pain in the setting of a fall yesterday. She is well-appearing in no acute distress. Vital signs are non-concerning. She is neurologically intact on exam, with primarily right paraspinal tenderness to palpation. X-rays will be performed of her low back in the setting of her fall. She is able to ambulate here in the emergency department, and I do not suspect fracture or neurological injury.   Lavera Guiseana Duo Kareem Cathey, MD 06/26/15 1003

## 2015-06-26 NOTE — ED Notes (Signed)
Patient with no complaints at this time. Respirations even and unlabored. Skin warm/dry. Discharge instructions reviewed with patient at this time. Patient given opportunity to voice concerns/ask questions. Patient discharged at this time and left Emergency Department with steady gait.   

## 2015-06-26 NOTE — ED Notes (Signed)
Pt c/o pain in lower back, r hip, and r leg  Since yesterday.  Reports has history of surgery to back and has pain intermittently.  Pt says pain started after tripping yesterday.

## 2018-03-15 ENCOUNTER — Encounter (HOSPITAL_COMMUNITY): Payer: Self-pay

## 2018-03-15 ENCOUNTER — Emergency Department (HOSPITAL_COMMUNITY): Payer: Medicare Other

## 2018-03-15 ENCOUNTER — Emergency Department (HOSPITAL_COMMUNITY)
Admission: EM | Admit: 2018-03-15 | Discharge: 2018-03-15 | Disposition: A | Discharge status: Home / Self Care | Payer: Medicare Other | Attending: Emergency Medicine | Admitting: Emergency Medicine

## 2018-03-15 ENCOUNTER — Other Ambulatory Visit: Payer: Self-pay

## 2018-03-15 DIAGNOSIS — I10 Essential (primary) hypertension: Secondary | ICD-10-CM | POA: Diagnosis not present

## 2018-03-15 DIAGNOSIS — M25562 Pain in left knee: Secondary | ICD-10-CM | POA: Diagnosis present

## 2018-03-15 DIAGNOSIS — Z79899 Other long term (current) drug therapy: Secondary | ICD-10-CM | POA: Insufficient documentation

## 2018-03-15 DIAGNOSIS — F17200 Nicotine dependence, unspecified, uncomplicated: Secondary | ICD-10-CM | POA: Insufficient documentation

## 2018-03-15 DIAGNOSIS — R7303 Prediabetes: Secondary | ICD-10-CM | POA: Diagnosis not present

## 2018-03-15 HISTORY — DX: Prediabetes: R73.03

## 2018-03-15 MED ORDER — ACETAMINOPHEN 325 MG PO TABS
650.0000 mg | ORAL_TABLET | Freq: Once | ORAL | Status: AC
  Administered 2018-03-15: 650 mg via ORAL
  Filled 2018-03-15: qty 2

## 2018-03-15 MED ORDER — NAPROXEN 375 MG PO TABS: 375.0000 mg | ORAL_TABLET | Freq: Two times a day (BID) | ORAL | 0 refills | Status: AC

## 2018-03-15 NOTE — Discharge Instructions (Addendum)
Please read and follow all provided instructions.  You have been seen today for left knee pain  Tests performed today include: An x-ray of the affected area - does NOT show any broken bones or dislocations.  Vital signs. See below for your results today.   Home care instructions: -- *PRICE in the first 24-48 hours after injury: Protect (with brace, splint, sling), if given by your provider Rest Ice- Do not apply ice pack directly to your skin, place towel or similar between your skin and ice/ice pack. Apply ice for 20 min, then remove for 40 min while awake Compression- Wear brace, elastic bandage, splint as directed by your provider Elevate affected extremity above the level of your heart when not walking around for the first 24-48 hours   Take naproxen with food as directed. Do not take if you think you may be pregnant.   Follow-up instructions: Please follow-up with your primary care provider or the provided orthopedic physician (bone specialist) if you continue to have significant pain in 1 week. In this case you may have a more severe injury that requires further care.   Return instructions:  Please return if your toes or feet are numb or tingling, appear gray or blue, or you have severe pain (also elevate the leg and loosen splint or wrap if you were given one) Please return to the Emergency Department if you experience worsening symptoms.  Please return if you have any other emergent concerns. Additional Information:  Your vital signs today were: BP (!) 160/100 (BP Location: Left Arm)    Pulse 64    Temp 98 F (36.7 C) (Oral)    Resp 18    Ht 5' 6.75" (1.695 m)    Wt 64.4 kg (142 lb)    SpO2 100%    BMI 22.41 kg/m  If your blood pressure (BP) was elevated above 135/85 this visit, please have this repeated by your doctor within one month. ---------------

## 2018-03-15 NOTE — ED Triage Notes (Signed)
Pt endorses left knee pain x 4 days ago when kneeling down on the floor and "felt something tear" VSS.

## 2018-03-15 NOTE — ED Provider Notes (Signed)
[facility] EMERGENCY DEPARTMENT Provider Note   CSN: [id number] Arrival date & time: 03/15/18  1200     History   Chief Complaint Chief Complaint  Patient presents with  . Knee Pain    HPI Katherine Russell is a 57 y.o. female with a history of chronic back pain, hypertension, arthritis who presents emergency department today for left knee pain.  Patient reports that 4 days ago she was bending down when she felt like something tore inside her knee.  No other time she walks she feels like something is tearing.  She rates her pain as a 9/10.  She notes it is constant and aggravated by movement and ambulation. She has not tried anything for her symptoms. Nothing makes it better. No prior injuries or surgery to the knee. She denies fever, chills, joint swelling/erythema, numbness/tingling/weakness.   HPI  Past Medical History:  Diagnosis Date  . Arthritis   . Chronic back pain   . Hypertension   . Hyperthyroidism   . Prediabetes     Patient Active Problem List   Diagnosis Date Noted  . Chronic back pain     Past Surgical History:  Procedure Laterality Date  . ABDOMINAL HYSTERECTOMY     1983  . ANTERIOR CERVICAL DECOMP/DISCECTOMY FUSION  07/12/2011   Procedure: ANTERIOR CERVICAL DECOMPRESSION/DISCECTOMY FUSION 2 LEVELS;  Surgeon: Tia Alert;  Location: [facility];  Service: Neurosurgery;  Laterality: N/A;  Cervical five-six, six-seven anterior cervical decompression/discectomy fusion with removal of hardware from Cervical four-five  . BACK SURGERY  2009  . HAMMER TOE SURGERY   2002   RIGHT  SIDE   . HERNIA REPAIR  AS INFANT   . KNEE SURGERY     RIGHT   . NECK SURGERY  2011  . NECK SURGERY    . OVARY SURGERY  1992   CYST REMOVED   . THYROID SURGERY  3/04  AND 3/85      OB History   None      Home Medications    Prior to Admission medications   Medication Sig Start Date End Date Taking? Authorizing Provider  amitriptyline (ELAVIL) 100 MG  tablet Take 100 mg by mouth at bedtime.    [provider]  amLODipine (NORVASC) 10 MG tablet Take 10 mg by mouth daily.    [provider]  baclofen (LIORESAL) 10 MG tablet Take 10 mg by mouth 3 (three) times daily.    [provider]  cyclobenzaprine (FLEXERIL) 10 MG tablet Take 1 tablet (10 mg total) by mouth 2 (two) times daily as needed for muscle spasms. 06/26/15   Lavera Guise, MD  gabapentin (NEURONTIN) 600 MG tablet Take 600 mg by mouth 3 (three) times daily.      [provider]  levothyroxine (SYNTHROID, LEVOTHROID) 100 MCG tablet Take 100 mcg by mouth daily.      [provider]  meloxicam (MOBIC) 7.5 MG tablet Take 7.5 mg by mouth 2 (two) times daily.     [provider]  Multiple Vitamin (MULITIVITAMIN WITH MINERALS) TABS Take 1 tablet by mouth daily.    [provider]  naproxen (NAPROSYN) 500 MG tablet Take 1 tablet (500 mg total) by mouth 2 (two) times daily as needed for moderate pain. 06/26/15   Lavera Guise, MD  predniSONE (STERAPRED UNI-PAK 21 TAB) 10 MG (21) TBPK tablet Take 1 tablet (10 mg total) by mouth daily. Take 6 tabs by mouth daily  for 2 days, then 5 tabs for 2 days, then 4 tabs for 2 days, then 3 tabs for 2 days, 2 tabs for 2 days, then 1 tab by mouth daily for 2 days 06/26/15   Lavera Guise, MD  pregabalin (LYRICA) 300 MG capsule Take 300 mg by mouth 2 (two) times daily.    [provider]  calcium carbonate (OS-CAL) 600 MG TABS Take 600 mg by mouth daily.    11/05/11  [provider]    Family History History reviewed. No pertinent family history.  Social History Social History   Tobacco Use  . Smoking status: Current Every Day Smoker    Packs/day: 0.50  Substance Use Topics  . Alcohol use: Yes    Comment: occ  . Drug use: Yes    Types: Marijuana     Allergies   Penicillins and Penicillins cross reactors   Review of Systems Review of Systems  All other systems  reviewed and are negative.    Physical Exam Updated Vital Signs BP (!) 160/100 (BP Location: Left Arm)   Pulse 64   Temp 98 F (36.7 C) (Oral)   Resp 18   Ht 5' 6.75" (1.695 m)   Wt 64.4 kg (142 lb)   SpO2 100%   BMI 22.41 kg/m   Physical Exam  Constitutional: She appears well-developed and well-nourished.  HENT:  Head: Normocephalic and atraumatic.  Right Ear: External ear normal.  Left Ear: External ear normal.  Eyes: Conjunctivae are normal. Right eye exhibits no discharge. Left eye exhibits no discharge. No scleral icterus.  Cardiovascular:  Pulses:      Dorsalis pedis pulses are 2+ on the right side, and 2+ on the left side.       Posterior tibial pulses are 2+ on the right side, and 2+ on the left side.  No lower extremity edema or swelling. Negative homans. Calves are symmetric in size b/l.   Pulmonary/Chest: Effort normal. No respiratory distress.  Musculoskeletal:  Right knee: Appearance normal. No obvious deformity. No skin swelling, erythema, heat, fluctuance or break of the skin. TTP over anterior joint line. Active and passive flexion and extension intact without pain or crepitus. Equivocal Lachman's test. Negative poster drawer bilaterally. Negative ballottement test. No varus or valgus laxity or locking. No TTP of hips or ankles. Compartments soft. Neurovascularly intact distally to site of injury.  Neurological: She is alert.  Skin: Skin is warm, dry and intact. Capillary refill takes less than 2 seconds. No erythema. No pallor.  Psychiatric: She has a normal mood and affect.  Nursing note and vitals reviewed.    ED Treatments / Results  Labs (all labs ordered are listed, but only abnormal results are displayed) Labs Reviewed - No data to display  EKG None  Radiology Dg Knee Complete 4 Views Left  Result Date: 03/15/2018 CLINICAL DATA:  Left knee pain after kneeling down on the floor and feeling a tearing sensation 4 days ago. EXAM: LEFT KNEE -  COMPLETE 4+ VIEW COMPARISON:  None. FINDINGS: No evidence of fracture, dislocation, or joint effusion. No evidence of arthropathy or other focal bone abnormality. Soft tissues are unremarkable. IMPRESSION: Normal examination. Electronically Signed   By: Beckie Salts M.D.   On: 03/15/2018 13:33    Procedures Procedures (including critical care time)  Medications Ordered in ED Medications  acetaminophen (TYLENOL) tablet 650 mg (has no administration in time range)     Initial Impression / Assessment and Plan / ED Course  I have reviewed the triage vital signs and the nursing notes.  Pertinent labs & imaging results that were available during my care of the patient were reviewed by me and considered in my medical decision making (see chart for details).      57 y.o. with left knee pain. No evidence of septic joint. She is NVI. Patient X-Ray negative for obvious fracture or dislocation. Pain managed in ED. Pt advised to follow up with orthopedics if symptoms persist for possibility of missed fracture diagnosis. Patient given knee immobilizer & crutches while in ED, conservative therapy recommended and discussed. Return precautions discussed. Patient will be dc home & is agreeable with above plan.  Final Clinical Impressions(s) / ED Diagnoses   Final diagnoses:  Acute pain of left knee    ED Discharge Orders        Ordered    naproxen (NAPROSYN) 375 MG tablet  2 times daily     03/15/18 1409       Princella PellegriniMaczis, Camree Wigington M, PA-C 03/15/18 1412    Little, Ambrose Finlandachel Morgan, MD 03/16/18 [phone]

## 2020-08-03 ENCOUNTER — Other Ambulatory Visit: Payer: Self-pay | Admitting: Orthopedic Surgery

## 2020-08-03 DIAGNOSIS — M545 Low back pain, unspecified: Secondary | ICD-10-CM

## 2020-08-03 DIAGNOSIS — M542 Cervicalgia: Secondary | ICD-10-CM

## 2020-08-04 ENCOUNTER — Telehealth: Payer: Self-pay

## 2020-08-04 NOTE — Telephone Encounter (Signed)
Phone call to patient to verify medication list and allergies for myelogram procedure. Pt instructed to hold Elavil for 48hrs prior to myelogram appointment time and 24 hours after appointment. Pt also instructed to have a driver the day of the procedure, the procedure would take around 2 hours, and discharge instructions discussed. Pt verbalized understanding.   

## 2020-08-12 ENCOUNTER — Ambulatory Visit
Admission: RE | Admit: 2020-08-12 | Discharge: 2020-08-12 | Disposition: A | Discharge status: Home / Self Care | Payer: 59 | Source: Ambulatory Visit | Attending: Orthopedic Surgery | Admitting: Orthopedic Surgery

## 2020-08-12 ENCOUNTER — Ambulatory Visit
Admission: RE | Admit: 2020-08-12 | Discharge: 2020-08-12 | Disposition: A | Discharge status: Home / Self Care | Payer: Medicare Other | Source: Ambulatory Visit | Attending: Orthopedic Surgery | Admitting: Orthopedic Surgery

## 2020-08-12 DIAGNOSIS — M545 Low back pain, unspecified: Secondary | ICD-10-CM

## 2020-08-12 DIAGNOSIS — M542 Cervicalgia: Secondary | ICD-10-CM

## 2020-08-12 MED ORDER — ONDANSETRON HCL 4 MG/2ML IJ SOLN
4.0000 mg | Freq: Once | INTRAMUSCULAR | Status: AC
  Administered 2020-08-12: 4 mg via INTRAMUSCULAR

## 2020-08-12 MED ORDER — MEPERIDINE HCL 50 MG/ML IJ SOLN
50.0000 mg | Freq: Once | INTRAMUSCULAR | Status: AC
  Administered 2020-08-12: 50 mg via INTRAMUSCULAR

## 2020-08-12 MED ORDER — DIAZEPAM 5 MG PO TABS
10.0000 mg | ORAL_TABLET | Freq: Once | ORAL | Status: AC
Start: 2020-08-12 — End: 2020-08-12
  Administered 2020-08-12: 10 mg via ORAL

## 2020-08-12 MED ORDER — IOPAMIDOL (ISOVUE-M 300) INJECTION 61%
10.0000 mL | Freq: Once | INTRAMUSCULAR | Status: AC
  Administered 2020-08-12: 10 mL via INTRATHECAL

## 2020-08-12 NOTE — Discharge Instructions (Signed)
Myelogram Discharge Instructions  1. Go home and rest quietly for the next 24 hours.  It is important to lie flat for the next 24 hours.  Get up only to go to the restroom.  You may lie in the bed or on a couch on your back, your stomach, your left side or your right side.  You may have one pillow under your head.  You may have pillows between your knees while you are on your side or under your knees while you are on your back.  2. DO NOT drive today.  Recline the seat as far back as it will go, while still wearing your seat belt, on the way home.  3. You may get up to go to the bathroom as needed.  You may sit up for 10 minutes to eat.  You may resume your normal diet and medications unless otherwise indicated.  Drink lots of extra fluids today and tomorrow.  4. The incidence of headache, nausea, or vomiting is about 5% (one in 20 patients).  If you develop a headache, lie flat and drink plenty of fluids until the headache goes away.  Caffeinated beverages may be helpful.  If you develop severe nausea and vomiting or a headache that does not go away with flat bed rest, call [phone].  5. You may resume normal activities after your 24 hours of bed rest is over; however, do not exert yourself strongly or do any heavy lifting tomorrow. If when you get up you have a headache when standing, go back to bed and force fluids for another 24 hours.  6. Call your physician for a follow-up appointment.  The results of your myelogram will be sent directly to your physician by the following day.  7. If you have any questions or if complications develop after you arrive home, please call [phone].  Discharge instructions have been explained to the patient.  The patient, or the person responsible for the patient, fully understands these instructions  Take your Elavil on 08/13/20 @ 9:30am which is tomorrow.

## 2020-08-12 NOTE — Discharge Instr - Other Orders (Signed)
1003: Pt c/o pain in BLE related to procedure. See MAR.

## 2020-08-12 NOTE — Progress Notes (Signed)
Pt reports she has been off of her Elavil for at least 48 hours.

## 2020-11-18 ENCOUNTER — Other Ambulatory Visit: Payer: Self-pay | Admitting: Orthopedic Surgery

## 2020-11-18 DIAGNOSIS — M542 Cervicalgia: Secondary | ICD-10-CM

## 2020-12-04 ENCOUNTER — Other Ambulatory Visit: Payer: Self-pay

## 2020-12-04 ENCOUNTER — Ambulatory Visit
Admission: RE | Admit: 2020-12-04 | Discharge: 2020-12-04 | Disposition: A | Discharge status: Home / Self Care | Payer: 59 | Source: Ambulatory Visit | Attending: Orthopedic Surgery | Admitting: Orthopedic Surgery

## 2020-12-04 DIAGNOSIS — M542 Cervicalgia: Secondary | ICD-10-CM

## 2021-12-16 ENCOUNTER — Other Ambulatory Visit: Payer: Self-pay | Admitting: Orthopedic Surgery

## 2021-12-16 DIAGNOSIS — M48061 Spinal stenosis, lumbar region without neurogenic claudication: Secondary | ICD-10-CM

## 2021-12-27 ENCOUNTER — Ambulatory Visit
Admission: RE | Admit: 2021-12-27 | Discharge: 2021-12-27 | Disposition: A | Discharge status: Home / Self Care | Payer: 59 | Source: Ambulatory Visit | Attending: Orthopedic Surgery | Admitting: Orthopedic Surgery

## 2021-12-27 DIAGNOSIS — M48061 Spinal stenosis, lumbar region without neurogenic claudication: Secondary | ICD-10-CM

## 2024-02-09 ENCOUNTER — Emergency Department (HOSPITAL_COMMUNITY)

## 2024-02-09 ENCOUNTER — Emergency Department (HOSPITAL_COMMUNITY)
Admission: EM | Admit: 2024-02-09 | Discharge: 2024-02-09 | Disposition: A | Discharge status: Home / Self Care | Attending: Emergency Medicine | Admitting: Emergency Medicine

## 2024-02-09 ENCOUNTER — Other Ambulatory Visit: Payer: Self-pay

## 2024-02-09 ENCOUNTER — Encounter (HOSPITAL_COMMUNITY): Payer: Self-pay

## 2024-02-09 DIAGNOSIS — S2232XA Fracture of one rib, left side, initial encounter for closed fracture: Secondary | ICD-10-CM | POA: Insufficient documentation

## 2024-02-09 DIAGNOSIS — I700 Atherosclerosis of aorta: Secondary | ICD-10-CM | POA: Diagnosis not present

## 2024-02-09 DIAGNOSIS — M4316 Spondylolisthesis, lumbar region: Secondary | ICD-10-CM | POA: Insufficient documentation

## 2024-02-09 DIAGNOSIS — K5730 Diverticulosis of large intestine without perforation or abscess without bleeding: Secondary | ICD-10-CM | POA: Diagnosis not present

## 2024-02-09 DIAGNOSIS — Z79899 Other long term (current) drug therapy: Secondary | ICD-10-CM | POA: Diagnosis not present

## 2024-02-09 DIAGNOSIS — W06XXXA Fall from bed, initial encounter: Secondary | ICD-10-CM | POA: Insufficient documentation

## 2024-02-09 DIAGNOSIS — S299XXA Unspecified injury of thorax, initial encounter: Secondary | ICD-10-CM | POA: Diagnosis present

## 2024-02-09 DIAGNOSIS — S3991XA Unspecified injury of abdomen, initial encounter: Secondary | ICD-10-CM

## 2024-02-09 DIAGNOSIS — W19XXXA Unspecified fall, initial encounter: Secondary | ICD-10-CM

## 2024-02-09 LAB — CBC WITH DIFFERENTIAL/PLATELET
Abs Immature Granulocytes: 0.09 10*3/uL — ABNORMAL HIGH (ref 0.00–0.07)
Basophils Absolute: 0 10*3/uL (ref 0.0–0.1)
Basophils Relative: 0 %
Eosinophils Absolute: 0 10*3/uL (ref 0.0–0.5)
Eosinophils Relative: 0 %
HCT: 41.2 % (ref 36.0–46.0)
Hemoglobin: 13.2 g/dL (ref 12.0–15.0)
Immature Granulocytes: 1 %
Lymphocytes Relative: 9 %
Lymphs Abs: 0.9 10*3/uL (ref 0.7–4.0)
MCH: 34.8 pg — ABNORMAL HIGH (ref 26.0–34.0)
MCHC: 32 g/dL (ref 30.0–36.0)
MCV: 108.7 fL — ABNORMAL HIGH (ref 80.0–100.0)
Monocytes Absolute: 0.7 10*3/uL (ref 0.1–1.0)
Monocytes Relative: 6 %
Neutro Abs: 9.1 10*3/uL — ABNORMAL HIGH (ref 1.7–7.7)
Neutrophils Relative %: 84 %
Platelets: 288 10*3/uL (ref 150–400)
RBC: 3.79 MIL/uL — ABNORMAL LOW (ref 3.87–5.11)
RDW: 14.1 % (ref 11.5–15.5)
WBC: 10.8 10*3/uL — ABNORMAL HIGH (ref 4.0–10.5)
nRBC: 0 % (ref 0.0–0.2)

## 2024-02-09 LAB — COMPREHENSIVE METABOLIC PANEL WITH GFR
ALT: 42 U/L (ref 0–44)
AST: 54 U/L — ABNORMAL HIGH (ref 15–41)
Albumin: 3.6 g/dL (ref 3.5–5.0)
Alkaline Phosphatase: 75 U/L (ref 38–126)
Anion gap: 9 (ref 5–15)
BUN: 34 mg/dL — ABNORMAL HIGH (ref 8–23)
CO2: 22 mmol/L (ref 22–32)
Calcium: 9 mg/dL (ref 8.9–10.3)
Chloride: 110 mmol/L (ref 98–111)
Creatinine, Ser: 2.25 mg/dL — ABNORMAL HIGH (ref 0.44–1.00)
GFR, Estimated: 24 mL/min — ABNORMAL LOW (ref >60)
Glucose, Bld: 125 mg/dL — ABNORMAL HIGH (ref 70–99)
Potassium: 5.2 mmol/L — ABNORMAL HIGH (ref 3.5–5.1)
Sodium: 141 mmol/L (ref 135–145)
Total Bilirubin: 0.3 mg/dL (ref 0.0–1.2)
Total Protein: 7 g/dL (ref 6.5–8.1)

## 2024-02-09 LAB — ETHANOL: Alcohol, Ethyl (B): <15 mg/dL (ref <15)

## 2024-02-09 MED ORDER — HYDROMORPHONE HCL 1 MG/ML IJ SOLN
0.5000 mg | Freq: Once | INTRAMUSCULAR | Status: AC
  Administered 2024-02-09: 0.5 mg via INTRAVENOUS
  Filled 2024-02-09: qty 0.5

## 2024-02-09 NOTE — ED Triage Notes (Signed)
 Pt stated that she has fallen out of bed multiple times and is complaining of left buttock pin and left rib pain. Reports hitting her head but is not on blood thinners

## 2024-02-09 NOTE — Discharge Instructions (Signed)
 You can take Tylenol  in addition to the pain medicine you are already take if necessary.  Follow-up with your PCP

## 2024-02-09 NOTE — ED Notes (Signed)
 Patient has no bruising under left ribs. Patient does have pain upon palpation, no redness.

## 2024-02-09 NOTE — ED Notes (Signed)
 Pt provided discharge instructions and prescription information. Pt was given the opportunity to ask questions and questions were answered.

## 2024-02-13 NOTE — ED Provider Notes (Signed)
 [place] EMERGENCY DEPARTMENT AT [facility] Provider Note   CSN: [id number] Arrival date & time: 02/09/24  1710     Patient presents with: Chest Pain and Rib Injury (From a fall)   Katherine Russell is a 63 y.o. female.   Patient fell out of bed and complains of left chest pain and left buttocks pain  The history is provided by the patient and medical records. No language interpreter was used.  Chest Pain Pain location:  L chest Pain quality: aching   Pain radiates to:  Does not radiate Pain severity:  Mild Onset quality:  Sudden Timing:  Constant Progression:  Worsening Chronicity:  New Relieved by:  Nothing Worsened by:  Nothing Ineffective treatments:  None tried Associated symptoms: no abdominal pain, no back pain, no cough, no fatigue and no headache        Prior to Admission medications   Medication Sig Start Date End Date Taking? Authorizing Provider  albuterol (VENTOLIN HFA) 108 (90 Base) MCG/ACT inhaler Inhale 1 puff into the lungs every 4 (four) hours as needed for wheezing or shortness of breath.    [provider]  amitriptyline (ELAVIL) 100 MG tablet Take 100 mg by mouth at bedtime.    [provider]  amLODipine (NORVASC) 5 MG tablet Take 5 mg by mouth daily. 01/24/24   [provider]  atorvastatin (LIPITOR) 10 MG tablet Take 10 mg by mouth daily.    [provider]  baclofen (LIORESAL) 10 MG tablet Take 10 mg by mouth 3 (three) times daily. Patient not taking: Reported on 08/04/2020    [provider]  carvedilol (COREG) 6.25 MG tablet Take 6.25 mg by mouth 2 (two) times daily.    [provider]  cyclobenzaprine  (FLEXERIL ) 10 MG tablet Take 1 tablet (10 mg total) by mouth 2 (two) times daily as needed for muscle spasms. 06/26/15   Liu, Dana Duo, MD  famotidine (PEPCID) 20 MG tablet Take 20 mg by mouth 2 (two) times daily.    [provider]  gabapentin  (NEURONTIN ) 800 MG  tablet Take 800 mg by mouth 3 (three) times daily.    [provider]  hydrochlorothiazide (HYDRODIURIL) 12.5 MG tablet Take 12.5 mg by mouth daily.    [provider]  hydrOXYzine (ATARAX) 25 MG tablet Take 25 mg by mouth 3 (three) times daily. 01/16/24   [provider]  levothyroxine  (SYNTHROID ) 88 MCG tablet Take 88 mcg by mouth daily.    [provider]  meloxicam (MOBIC) 7.5 MG tablet Take 7.5 mg by mouth 2 (two) times daily.  Patient not taking: Reported on 08/04/2020    [provider]  metFORMIN (GLUCOPHAGE-XR) 500 MG 24 hr tablet Take 500 mg by mouth 2 (two) times daily.    [provider]  Multiple Vitamin (MULITIVITAMIN WITH MINERALS) TABS Take 1 tablet by mouth daily.    [provider]  naproxen  (NAPROSYN ) 375 MG tablet Take 1 tablet (375 mg total) by mouth 2 (two) times daily. 03/15/18   Maczis, Michael M, PA-C  olmesartan (BENICAR) 40 MG tablet Take 40 mg by mouth daily.    [provider]  omeprazole (PRILOSEC) 40 MG capsule Take 40 mg by mouth daily. 01/24/24   [provider]  ondansetron  (ZOFRAN -ODT) 8 MG disintegrating tablet Take 8 mg by mouth 2 (two) times daily as needed for nausea or vomiting. 01/16/24   [provider]  oxyCODONE -acetaminophen  (PERCOCET) 10-325 MG tablet Take 1  tablet by mouth 4 (four) times daily as needed.    [provider]  predniSONE  (DELTASONE ) 10 MG tablet Take 10 mg by mouth 2 (two) times daily. 01/01/24   [provider]  pregabalin (LYRICA) 300 MG capsule Take 300 mg by mouth 2 (two) times daily. Patient not taking: Reported on 08/04/2020    [provider]  SUBOXONE 8-2 MG FILM Place 2 Film under the tongue daily. 02/06/24   [provider]  TRELEGY ELLIPTA 100-62.5-25 MCG/ACT AEPB Inhale 1 puff into the lungs daily. 12/31/23   [provider]  calcium carbonate (OS-CAL) 600 MG TABS Take 600 mg by mouth daily.    11/05/11   [provider]    Allergies: Penicillins, Penicillins cross reactors, and Tramadol    Review of Systems  Constitutional:  Negative for appetite change and fatigue.  HENT:  Negative for congestion, ear discharge and sinus pressure.   Eyes:  Negative for discharge.  Respiratory:  Negative for cough.   Cardiovascular:  Positive for chest pain.  Gastrointestinal:  Negative for abdominal pain and diarrhea.  Genitourinary:  Negative for frequency and hematuria.  Musculoskeletal:  Negative for back pain.  Skin:  Negative for rash.  Neurological:  Negative for seizures and headaches.  Psychiatric/Behavioral:  Negative for hallucinations.     Updated Vital Signs BP (!) 145/87   Pulse 87   Temp 98 F (36.7 C)   Resp 13   Ht 5' 6 (1.676 m)   Wt 72.6 kg   SpO2 94%   BMI 25.82 kg/m   Physical Exam Vitals and nursing note reviewed.  Constitutional:      Appearance: She is well-developed.  HENT:     Head: Normocephalic.     Nose: Nose normal.   Eyes:     General: No scleral icterus.    Conjunctiva/sclera: Conjunctivae normal.   Neck:     Thyroid: No thyromegaly.   Cardiovascular:     Rate and Rhythm: Normal rate and regular rhythm.     Heart sounds: No murmur heard.    No friction rub. No gallop.  Pulmonary:     Breath sounds: No stridor. No wheezing or rales.     Comments: Tender left chest Chest:     Chest wall: No tenderness.  Abdominal:     General: There is no distension.     Tenderness: There is no abdominal tenderness. There is no rebound.   Musculoskeletal:        General: Normal range of motion.     Cervical back: Neck supple.     Comments: Tender left buttock  Lymphadenopathy:     Cervical: No cervical adenopathy.   Skin:    Findings: No erythema or rash.   Neurological:     Mental Status: She is alert and oriented to person, place, and time.     Motor: No abnormal muscle tone.     Coordination: Coordination normal.   Psychiatric:         Behavior: Behavior normal.     (all labs ordered are listed, but only abnormal results are displayed) Labs Reviewed  CBC WITH DIFFERENTIAL/PLATELET - Abnormal; Notable for the following components:      Result Value   WBC 10.8 (*)    RBC 3.79 (*)    MCV 108.7 (*)    MCH 34.8 (*)    Neutro Abs 9.1 (*)    Abs Immature Granulocytes 0.09 (*)    All other components within  normal limits  COMPREHENSIVE METABOLIC PANEL WITH GFR - Abnormal; Notable for the following components:   Potassium 5.2 (*)    Glucose, Bld 125 (*)    BUN 34 (*)    Creatinine, Ser 2.25 (*)    AST 54 (*)    GFR, Estimated 24 (*)    All other components within normal limits  ETHANOL    EKG: None  Radiology: No results found.   Procedures   Medications Ordered in the ED  HYDROmorphone  (DILAUDID ) injection 0.5 mg (0.5 mg Intravenous Given 02/09/24 1931)                                    Medical Decision Making Amount and/or Complexity of Data Reviewed Labs: ordered. Radiology: ordered.  Risk Prescription drug management.   Patient with a fall and fractured left rib and contusion left buttock     Final diagnoses:  Fall, initial encounter  Closed fracture of one rib of left side, initial encounter    ED Discharge Orders     None          Cheyenne Cotta, MD 02/13/24 1224
